# Patient Record
Sex: Male | Born: 1965 | Race: White | Hispanic: No | Smoking: Current every day smoker
Health system: Southern US, Community
[De-identification: ages and names within clinical notes are randomized; demographics above are authoritative.]

## PROBLEM LIST (undated history)

## (undated) DIAGNOSIS — L4050 Arthropathic psoriasis, unspecified: Secondary | ICD-10-CM

---

## 2019-10-08 ENCOUNTER — Emergency Department
Admission: EM | Admit: 2019-10-08 | Discharge: 2019-10-09 | Disposition: A | Discharge status: Other Acute Inpatient Hospital | Payer: Self-pay | Attending: Psychiatry | Admitting: Psychiatry

## 2019-10-08 ENCOUNTER — Emergency Department: Payer: Self-pay

## 2019-10-08 ENCOUNTER — Encounter: Payer: Self-pay | Admitting: Emergency Medicine

## 2019-10-08 ENCOUNTER — Other Ambulatory Visit: Payer: Self-pay

## 2019-10-08 DIAGNOSIS — Z20822 Contact with and (suspected) exposure to covid-19: Secondary | ICD-10-CM | POA: Insufficient documentation

## 2019-10-08 DIAGNOSIS — T424X1A Poisoning by benzodiazepines, accidental (unintentional), initial encounter: Secondary | ICD-10-CM

## 2019-10-08 DIAGNOSIS — F329 Major depressive disorder, single episode, unspecified: Secondary | ICD-10-CM | POA: Diagnosis present

## 2019-10-08 DIAGNOSIS — R4182 Altered mental status, unspecified: Secondary | ICD-10-CM | POA: Insufficient documentation

## 2019-10-08 DIAGNOSIS — F32A Depression, unspecified: Secondary | ICD-10-CM | POA: Diagnosis present

## 2019-10-08 DIAGNOSIS — F319 Bipolar disorder, unspecified: Secondary | ICD-10-CM

## 2019-10-08 DIAGNOSIS — T50904A Poisoning by unspecified drugs, medicaments and biological substances, undetermined, initial encounter: Secondary | ICD-10-CM | POA: Insufficient documentation

## 2019-10-08 HISTORY — DX: Arthropathic psoriasis, unspecified: L40.50

## 2019-10-08 LAB — CBC WITH DIFFERENTIAL/PLATELET
Abs Immature Granulocytes: 0.04 10*3/uL (ref 0.00–0.07)
Basophils Absolute: 0.1 10*3/uL (ref 0.0–0.1)
Basophils Relative: 1 %
Eosinophils Absolute: 0.1 10*3/uL (ref 0.0–0.5)
Eosinophils Relative: 1 %
HCT: 46.1 % (ref 39.0–52.0)
Hemoglobin: 15.9 g/dL (ref 13.0–17.0)
Immature Granulocytes: 0 %
Lymphocytes Relative: 26 %
Lymphs Abs: 2.8 10*3/uL (ref 0.7–4.0)
MCH: 33.1 pg (ref 26.0–34.0)
MCHC: 34.5 g/dL (ref 30.0–36.0)
MCV: 96 fL (ref 80.0–100.0)
Monocytes Absolute: 0.6 10*3/uL (ref 0.1–1.0)
Monocytes Relative: 6 %
Neutro Abs: 7.3 10*3/uL (ref 1.7–7.7)
Neutrophils Relative %: 66 %
Platelets: 280 10*3/uL (ref 150–400)
RBC: 4.8 MIL/uL (ref 4.22–5.81)
RDW: 12.1 % (ref 11.5–15.5)
WBC: 10.9 10*3/uL — ABNORMAL HIGH (ref 4.0–10.5)
nRBC: 0 % (ref 0.0–0.2)

## 2019-10-08 LAB — URINE DRUG SCREEN, QUALITATIVE (ARMC ONLY)
Amphetamines, Ur Screen: NOT DETECTED
Barbiturates, Ur Screen: NOT DETECTED
Benzodiazepine, Ur Scrn: POSITIVE — AB
Cannabinoid 50 Ng, Ur ~~LOC~~: NOT DETECTED
Cocaine Metabolite,Ur ~~LOC~~: NOT DETECTED
MDMA (Ecstasy)Ur Screen: NOT DETECTED
Methadone Scn, Ur: NOT DETECTED
Opiate, Ur Screen: NOT DETECTED
Phencyclidine (PCP) Ur S: NOT DETECTED
Tricyclic, Ur Screen: NOT DETECTED

## 2019-10-08 LAB — ETHANOL: Alcohol, Ethyl (B): <10 mg/dL (ref <10)

## 2019-10-08 LAB — COMPREHENSIVE METABOLIC PANEL
ALT: 16 U/L (ref 0–44)
AST: 14 U/L — ABNORMAL LOW (ref 15–41)
Albumin: 4.2 g/dL (ref 3.5–5.0)
Alkaline Phosphatase: 86 U/L (ref 38–126)
Anion gap: 6 (ref 5–15)
BUN: 9 mg/dL (ref 6–20)
CO2: 28 mmol/L (ref 22–32)
Calcium: 8.6 mg/dL — ABNORMAL LOW (ref 8.9–10.3)
Chloride: 103 mmol/L (ref 98–111)
Creatinine, Ser: 0.93 mg/dL (ref 0.61–1.24)
GFR calc Af Amer: >60 mL/min (ref >60)
GFR calc non Af Amer: >60 mL/min (ref >60)
Glucose, Bld: 105 mg/dL — ABNORMAL HIGH (ref 70–99)
Potassium: 3.6 mmol/L (ref 3.5–5.1)
Sodium: 137 mmol/L (ref 135–145)
Total Bilirubin: 0.6 mg/dL (ref 0.3–1.2)
Total Protein: 6.8 g/dL (ref 6.5–8.1)

## 2019-10-08 LAB — URINALYSIS, ROUTINE W REFLEX MICROSCOPIC
Bilirubin Urine: NEGATIVE
Glucose, UA: NEGATIVE mg/dL
Hgb urine dipstick: NEGATIVE
Ketones, ur: NEGATIVE mg/dL
Leukocytes,Ua: NEGATIVE
Nitrite: NEGATIVE
Protein, ur: NEGATIVE mg/dL
Specific Gravity, Urine: 1.003 — ABNORMAL LOW (ref 1.005–1.030)
pH: 7 (ref 5.0–8.0)

## 2019-10-08 LAB — SALICYLATE LEVEL
Salicylate Lvl: <7 mg/dL — ABNORMAL LOW (ref 7.0–30.0)
Salicylate Lvl: <7 mg/dL — ABNORMAL LOW (ref 7.0–30.0)

## 2019-10-08 LAB — ACETAMINOPHEN LEVEL
Acetaminophen (Tylenol), Serum: <10 ug/mL — ABNORMAL LOW (ref 10–30)
Acetaminophen (Tylenol), Serum: <10 ug/mL — ABNORMAL LOW (ref 10–30)

## 2019-10-08 LAB — MAGNESIUM: Magnesium: 2.4 mg/dL (ref 1.7–2.4)

## 2019-10-08 MED ORDER — DIPHENHYDRAMINE HCL 50 MG/ML IJ SOLN
50.0000 mg | Freq: Once | INTRAMUSCULAR | Status: AC
  Administered 2019-10-08: 19:00:00 50 mg via INTRAMUSCULAR

## 2019-10-08 MED ORDER — DIPHENHYDRAMINE HCL 50 MG/ML IJ SOLN
INTRAMUSCULAR | Status: AC
  Filled 2019-10-08: qty 1

## 2019-10-08 MED ORDER — HALOPERIDOL LACTATE 5 MG/ML IJ SOLN
INTRAMUSCULAR | Status: AC
Start: 2019-10-08 — End: 2019-10-09
  Filled 2019-10-08: qty 1

## 2019-10-08 MED ORDER — LORAZEPAM 2 MG/ML IJ SOLN
2.0000 mg | Freq: Once | INTRAMUSCULAR | Status: AC
  Administered 2019-10-08: 2 mg via INTRAMUSCULAR

## 2019-10-08 MED ORDER — HALOPERIDOL LACTATE 5 MG/ML IJ SOLN
5.0000 mg | Freq: Once | INTRAMUSCULAR | Status: AC
  Administered 2019-10-08: 5 mg via INTRAMUSCULAR

## 2019-10-08 MED ORDER — LORAZEPAM 2 MG/ML IJ SOLN
INTRAMUSCULAR | Status: AC
  Filled 2019-10-08: qty 1

## 2019-10-08 NOTE — ED Notes (Addendum)
Writer placed blankets on pt because of pt stating he was cold. Pt woke up and asked writer "where am I?", " how did I get here?" explained to pt where he was and how he got here.
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
## 2019-10-08 NOTE — ED Provider Notes (Signed)
[facility] Department Provider Note  ____________________________________________   First MD Initiated Contact with Patient 10/08/19 1326     (approximate)  I have reviewed the triage vital signs and the nursing notes.   HISTORY  Chief Complaint Drug Overdose    HPI Tom Smith. is a 54 y.o. male with history of psoriatic arthritis who comes in with concerns for overdose.  Patient was brought in by family member for concern for overdose.  Unclear exactly what he took.  There was concern for possible Xanax versus Zoloft although he tells me he did not take anything.  When asked if he was trying to do intentionally he says "trying to get out ".  Essentially there is some concern that this could have been intentional in nature.  Patient is alert and oriented x3 although does have slurred speech.  Patient is unable to give a full history of what happened.  Unable to get full history due to altered mental status          Past Medical History:  Diagnosis Date  . Psoriatic arthritis ([place])     There are no problems to display for this patient.   History reviewed. No pertinent surgical history.  Prior to Admission medications   Not on File    Allergies Patient has no known allergies.  No family history on file.  Social History Social History   Tobacco Use  . Smoking status: Unknown If Ever Smoked  Substance Use Topics  . Alcohol use: Not Currently    Comment: UTA  . Drug use: Yes    Comment: Overdosed on prescription pills 3/7      Review of Systems Unable to get full review of systems due to patient's altered mental status ____________________________________________   PHYSICAL EXAM:  VITAL SIGNS: ED Triage Vitals [10/08/19 1316]  Enc Vitals Group     BP 113/79     Pulse Rate (!) 103     Resp 20     Temp 97.8 F (36.6 C)     Temp Source Oral     SpO2 99 %     Weight 180 lb (81.6 kg)     Height 5\' 6"  (1.676 m)       Head Circumference      Peak Flow      Pain Score 0     Pain Loc      Pain Edu?      Excl. in [place]?     Constitutional: Alert and oriented.  Although has significantly slurred speech Eyes: Conjunctivae are normal. EOMI. Head: Atraumatic. Nose: No congestion/rhinnorhea. Mouth/Throat: Mucous membranes are moist.   Neck: No stridor. Trachea Midline. FROM Cardiovascular: Tachycardic, regular rhythm. Grossly normal heart sounds.  Good peripheral circulation. Respiratory: Normal respiratory effort.  No retractions. Lungs CTAB. Gastrointestinal: Soft and nontender. No distention. No abdominal bruits.  Musculoskeletal: No lower extremity tenderness nor edema.  No joint effusions. Neurologic: Cranial nerves appear intact with equal strength in arms and legs although he does have significant slurred speech and difficulty with moving from the wheelchair to the bed due to concern for intoxication Skin:  Skin is warm, dry and intact. No rash noted. Psychiatric: Possible SI.  Unclear.  Possible overdose GU: Deferred   ____________________________________________   LABS (all labs ordered are listed, but only abnormal results are displayed)  Labs Reviewed  CBC WITH DIFFERENTIAL/PLATELET - Abnormal; Notable for the following components:      Result Value  WBC 10.9 (*)    All other components within normal limits  COMPREHENSIVE METABOLIC PANEL  MAGNESIUM  ETHANOL  URINE DRUG SCREEN, QUALITATIVE ([facility] ONLY)  URINALYSIS, ROUTINE W REFLEX MICROSCOPIC  SALICYLATE LEVEL  ACETAMINOPHEN LEVEL   ____________________________________________   ED ECG REPORT I, Concha Se, the attending physician, personally viewed and interpreted this ECG.   EKG is normal sinus rate of 91, no ST elevation, no T wave inversions, normal intervals ____________________________________________  RADIOLOGY Vela Prose, personally viewed and evaluated these images (plain radiographs) as part of my medical  decision making, as well as reviewing the written report by the radiologist.  ED MD interpretation:  Pending   Official radiology report(s): No results found.  ____________________________________________   PROCEDURES  Procedure(s) performed (including Critical Care):  Procedures   ____________________________________________   INITIAL IMPRESSION / ASSESSMENT AND PLAN / ED COURSE  Tom Smith. was evaluated in Emergency Department on 10/08/2019 for the symptoms described in the history of present illness. He was evaluated in the context of the global COVID-19 pandemic, which necessitated consideration that the patient might be at risk for infection with the SARS-CoV-2 virus that causes COVID-19. Institutional protocols and algorithms that pertain to the evaluation of patients at risk for COVID-19 are in a state of rapid change based on information released by regulatory bodies including the CDC and federal and state organizations. These policies and algorithms were followed during the patient's care in the ED.     Patient is a 54 year old who comes in with concern for possible drug overdose although patient is alert and oriented x3 he is unable to give [place] any other information of what he took or when he took medications.  Will need to get labs now and repeat 4-hour Tylenol and aspirin levels.  Will get CT head to make sure no evidence of intracranial hemorrhage.  Will get other labs to evaluate for electrolyte abnormalities, AKI.  Will get urine drug screen.  Given patient's altered mental status and the concern this could have been SI will IVC patient  Patient is labs are reassuring.  Ordered four hour Tylenol aspirin levels.  Reevaluated patient and he is sleeping.  Patient handed off to oncoming team pending CT reads, repeat Tylenol salicylate levels and then placing consults to be evaluated by psych and TTS once patient is a wake       ____________________________________________   FINAL CLINICAL IMPRESSION(S) / ED DIAGNOSES   Final diagnoses:  Drug overdose, undetermined intent, initial encounter      MEDICATIONS GIVEN DURING THIS VISIT:  Medications - No data to display   ED Discharge Orders    None       Note:  This document was prepared using Dragon voice recognition software and may include unintentional dictation errors.   Concha Se, MD 10/08/19 1455

## 2019-10-08 NOTE — ED Triage Notes (Signed)
Pt now endorsing that he took medications at approx 1145. Pt continues to have slurred speech, but is oriented to time, oriented to situation, oriented to person, disoriented to place. Pt brought back to room 3, EDP and primary RN at bedside at this time.

## 2019-10-08 NOTE — ED Notes (Signed)
Dr Derrill Kay informed pt sleeping at this time and he is good with removing restraints. Restraints removed from both arms and legs. Pt remains asleep. Will continue to monitor.

## 2019-10-08 NOTE — ED Triage Notes (Signed)
Pt states took overdose of a knock off of Xanax. Pt states he lost count of how much he took PTA. Pt noted to be lethargic with slurred/soft speech. Pt states "trying to get out" when asked if overdose was intentional.   Pt now states that he overdosed on generic Zoloft, unable to state how much he took.
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
## 2019-10-08 NOTE — ED Triage Notes (Signed)
First Rn Note: pt presents to ED via wheelchair with c/o overdose, pt presents with family member who states patient's overdosed. Pt arrives to ED alert and able to answer some questions.

## 2019-10-08 NOTE — ED Provider Notes (Signed)
Per report from nursing staff patient awoke and was found in the hallway. When staff approached patient he started swinging at staff. By the time of my arrival the patient was being restrained by staff. He was agitated, yelling and cursing. Given violent behavior the patient was given benadryl, haldol and ativan. He will be placed in soft restraints until patient becomes calm and no longer poses a threat to staff.   Patient did fall asleep after medication and staff was able to take patient out of restraints   Phineas Semen, MD 10/08/19 2339

## 2019-10-08 NOTE — ED Notes (Addendum)
Pt placed in 4 point restraints at this time per verbal order by Dr. Derrill Kay.

## 2019-10-08 NOTE — ED Notes (Signed)
Pt undressed by this RN, [place] EDT and Shanda Bumps, RN with assistance of [employer] to ensure pt did not kick while restraints removed from leg to remove his pants. Pt assisted to use urinal at this time also. Pt belongings placed in bag and include one pair of black canvas sneakers, one pair of black socks and one pair of blue underwear. Belongings in a bag brought from prior room include green plaid flannel type jacket and one camouflage t shirt. Total of 2 bags labeled and placed in locker room.

## 2019-10-09 ENCOUNTER — Other Ambulatory Visit: Payer: Self-pay

## 2019-10-09 ENCOUNTER — Encounter: Payer: Self-pay | Admitting: Psychiatry

## 2019-10-09 ENCOUNTER — Inpatient Hospital Stay
Admission: EM | Admit: 2019-10-09 | Discharge: 2019-10-16 | DRG: 885 | Disposition: A | Discharge status: Home / Self Care | Payer: Self-pay | Source: Intra-hospital | Attending: Psychiatry | Admitting: Psychiatry

## 2019-10-09 DIAGNOSIS — Z79899 Other long term (current) drug therapy: Secondary | ICD-10-CM

## 2019-10-09 DIAGNOSIS — F319 Bipolar disorder, unspecified: Secondary | ICD-10-CM | POA: Diagnosis present

## 2019-10-09 DIAGNOSIS — T424X1A Poisoning by benzodiazepines, accidental (unintentional), initial encounter: Secondary | ICD-10-CM

## 2019-10-09 DIAGNOSIS — F419 Anxiety disorder, unspecified: Secondary | ICD-10-CM | POA: Diagnosis present

## 2019-10-09 DIAGNOSIS — L4050 Arthropathic psoriasis, unspecified: Secondary | ICD-10-CM | POA: Diagnosis present

## 2019-10-09 DIAGNOSIS — F17210 Nicotine dependence, cigarettes, uncomplicated: Secondary | ICD-10-CM | POA: Diagnosis present

## 2019-10-09 DIAGNOSIS — F3130 Bipolar disorder, current episode depressed, mild or moderate severity, unspecified: Principal | ICD-10-CM | POA: Diagnosis present

## 2019-10-09 DIAGNOSIS — Z23 Encounter for immunization: Secondary | ICD-10-CM

## 2019-10-09 DIAGNOSIS — R45851 Suicidal ideations: Secondary | ICD-10-CM | POA: Diagnosis present

## 2019-10-09 DIAGNOSIS — F32A Depression, unspecified: Secondary | ICD-10-CM | POA: Diagnosis present

## 2019-10-09 DIAGNOSIS — F329 Major depressive disorder, single episode, unspecified: Secondary | ICD-10-CM | POA: Diagnosis present

## 2019-10-09 DIAGNOSIS — Z20822 Contact with and (suspected) exposure to covid-19: Secondary | ICD-10-CM | POA: Diagnosis present

## 2019-10-09 DIAGNOSIS — Z915 Personal history of self-harm: Secondary | ICD-10-CM

## 2019-10-09 DIAGNOSIS — G4700 Insomnia, unspecified: Secondary | ICD-10-CM | POA: Diagnosis present

## 2019-10-09 LAB — RESPIRATORY PANEL BY RT PCR (FLU A&B, COVID)
Influenza A by PCR: NEGATIVE
Influenza B by PCR: NEGATIVE
SARS Coronavirus 2 by RT PCR: NEGATIVE

## 2019-10-09 MED ORDER — MAGNESIUM HYDROXIDE 400 MG/5ML PO SUSP: 30.0000 mL | Freq: Every day | ORAL | Status: DC | PRN

## 2019-10-09 MED ORDER — ALUM & MAG HYDROXIDE-SIMETH 200-200-20 MG/5ML PO SUSP: 30.0000 mL | ORAL | Status: DC | PRN

## 2019-10-09 MED ORDER — PNEUMOCOCCAL VAC POLYVALENT 25 MCG/0.5ML IJ INJ
0.5000 mL | INJECTION | INTRAMUSCULAR | Status: DC
  Filled 2019-10-09: qty 0.5

## 2019-10-09 MED ORDER — QUETIAPINE FUMARATE 25 MG PO TABS
50.0000 mg | ORAL_TABLET | Freq: Every day | ORAL | Status: DC
  Administered 2019-10-09: 50 mg via ORAL
  Filled 2019-10-09: qty 2

## 2019-10-09 MED ORDER — INFLUENZA VAC SPLIT QUAD 0.5 ML IM SUSY: 0.5000 mL | PREFILLED_SYRINGE | INTRAMUSCULAR | Status: DC

## 2019-10-09 MED ORDER — ACETAMINOPHEN 325 MG PO TABS
650.0000 mg | ORAL_TABLET | Freq: Four times a day (QID) | ORAL | Status: DC | PRN
  Administered 2019-10-10 – 2019-10-13 (×5): 650 mg via ORAL
  Filled 2019-10-09 (×5): qty 2

## 2019-10-09 NOTE — ED Notes (Signed)
Report provided to [facility] RN. Pt to be moved with tech and officer. Pts 2 bag with personal belongings sent with pt.

## 2019-10-09 NOTE — BH Assessment (Signed)
Assessment Note  Tom Smith. is an 54 y.o. male who presents to the ER due to taking an intentional overdose on his medication. Patient admits to dealing with depression and his close friends have told him they believe he was bipolar. Patient hasn't had any medications in approximately a year and have difficulty managing his mental health. He's been inpatient once and follow up with outpatient treatment in 2008.  During the interview, the patient was calm, cooperative and pleasant. However, upon arrival to the ER and initial interaction, the patient was aggressive and required restraints and IM medications. Patient was impaired. However, with this Clinical research associate the patient was polite and respectful. When ER Staff told him about what happened, he reported he didn't remember any of it and was apologetic. Patient denies HI and AV/H.  Diagnosis: Bipolar  Past Medical History:  Past Medical History:  Diagnosis Date  . Psoriatic arthritis ([facility])     History reviewed. No pertinent surgical history.  Family History: No family history on file.  Social History:  reports previous alcohol use. He reports current drug use. No history on file for tobacco.  Additional Social History:  Alcohol / Drug Use Pain Medications: See PTA Prescriptions: See PTA Over the Counter: See PTA History of alcohol / drug use?: Yes Longest period of sobriety (when/how long): Unable to quantify Substance #1 Name of Substance 1: Cannabis 1 - Last Use / Amount: 10/08/2019  CIWA: CIWA-Ar BP: 99/66 Pulse Rate: 68 COWS:    Allergies: No Known Allergies  Home Medications: (Not in a hospital admission)   OB/GYN Status:  No LMP for male patient.  General Assessment Data Location of Assessment: [facility] ED TTS Assessment: In system Is this a Tele or Face-to-Face Assessment?: Face-to-Face Is this an Initial Assessment or a Re-assessment for this encounter?: Initial Assessment Patient Accompanied by:: N/A Language Other  than English: No Living Arrangements: Other (Comment)(Private Home) What gender do you identify as?: Male Marital status: Single Pregnancy Status: No Living Arrangements: Alone Can pt return to current living arrangement?: No Admission Status: Involuntary Petitioner: ED Attending Is patient capable of signing voluntary admission?: No(Under IVC) Referral Source: Self/Family/Friend Insurance type: None  Medical Screening Exam [facility] Walk-in ONLY) Medical Exam completed: Yes  Crisis Care Plan Living Arrangements: Alone Legal Guardian: Other:(Self) Name of Psychiatrist: Reports of none Name of Therapist: Reports of none  Education Status Is patient currently in school?: No Is the patient employed, unemployed or receiving disability?: Unemployed, Receiving disability income  Risk to self with the past 6 months Suicidal Ideation: Yes-Currently Present Has patient been a risk to self within the past 6 months prior to admission? : Yes Suicidal Intent: Yes-Currently Present Has patient had any suicidal intent within the past 6 months prior to admission? : Yes Is patient at risk for suicide?: Yes Suicidal Plan?: Yes-Currently Present Has patient had any suicidal plan within the past 6 months prior to admission? : Yes Specify Current Suicidal Plan: Overdose on medications Access to Means: Yes Specify Access to Suicidal Means: Medications What has been your use of drugs/alcohol within the last 12 months?: Cannabis Previous Attempts/Gestures: No How many times?: 2 Other Self Harm Risks: Reports of none Triggers for Past Attempts: Unknown Intentional Self Injurious Behavior: None Family Suicide History: No Recent stressful life event(s): Conflict (Comment), Loss (Comment), Other (Comment) Persecutory voices/beliefs?: No Depression: Yes Depression Symptoms: Isolating, Fatigue, Guilt, Tearfulness, Loss of interest in usual pleasures, Feeling worthless/self pity Substance abuse history  and/or treatment for substance  abuse?: No Suicide prevention information given to non-admitted patients: Not applicable  Risk to Others within the past 6 months Homicidal Ideation: No Does patient have any lifetime risk of violence toward others beyond the six months prior to admission? : No Thoughts of Harm to Others: No Current Homicidal Intent: No Current Homicidal Plan: No Access to Homicidal Means: No Identified Victim: Reports of none History of harm to others?: No Assessment of Violence: None Noted Violent Behavior Description: Upon arrival to the ER Does patient have access to weapons?: No Criminal Charges Pending?: No Does patient have a court date: No Is patient on probation?: No  Psychosis Hallucinations: None noted Delusions: None noted  Mental Status Report Appearance/Hygiene: Unremarkable, In scrubs Eye Contact: Good Motor Activity: Freedom of movement, Unremarkable Speech: Logical/coherent, Unremarkable Level of Consciousness: Alert Mood: Anxious, Sad, Pleasant Affect: Appropriate to circumstance, Depressed Anxiety Level: Minimal Thought Processes: Coherent, Relevant Judgement: Unimpaired Orientation: Person, Place, Time, Situation, Appropriate for developmental age Obsessive Compulsive Thoughts/Behaviors: None  Cognitive Functioning Concentration: Normal Memory: Recent Intact, Remote Intact Is patient IDD: No Insight: Fair Impulse Control: Fair Appetite: Good Have you had any weight changes? : No Change Sleep: No Change Total Hours of Sleep: 8 Vegetative Symptoms: None  ADLScreening [facility] Assessment Services) Patient's cognitive ability adequate to safely complete daily activities?: Yes Patient able to express need for assistance with ADLs?: Yes Independently performs ADLs?: Yes (appropriate for developmental age)  Prior Inpatient Therapy Prior Inpatient Therapy: Yes Prior Therapy Dates: 2008 Prior Therapy Facilty/Provider(s): Unable to remember  the name Reason for Treatment: Depression  Prior Outpatient Therapy Prior Outpatient Therapy: Yes Prior Therapy Dates: 2008 Prior Therapy Facilty/Provider(s): Unable to remember the name Reason for Treatment: Depression Does patient have an ACCT team?: No Does patient have Intensive In-House Services?  : No Does patient have [facility] services? : No Does patient have P4CC services?: No  ADL Screening (condition at time of admission) Patient's cognitive ability adequate to safely complete daily activities?: Yes Is the patient deaf or have difficulty hearing?: No Does the patient have difficulty seeing, even when wearing glasses/contacts?: No Does the patient have difficulty concentrating, remembering, or making decisions?: No Patient able to express need for assistance with ADLs?: Yes Does the patient have difficulty dressing or bathing?: No Independently performs ADLs?: Yes (appropriate for developmental age) Does the patient have difficulty walking or climbing stairs?: No Weakness of Legs: None Weakness of Arms/Hands: None  Home Assistive Devices/Equipment Home Assistive Devices/Equipment: None  Therapy Consults (therapy consults require a physician order) PT Evaluation Needed: No OT Evalulation Needed: No SLP Evaluation Needed: No Abuse/Neglect Assessment (Assessment to be complete while patient is alone) Abuse/Neglect Assessment Can Be Completed: Yes Physical Abuse: Denies Verbal Abuse: Denies Sexual Abuse: Denies Exploitation of patient/patient's resources: Denies Self-Neglect: Denies Values / Beliefs Cultural Requests During Hospitalization: None Spiritual Requests During Hospitalization: None Consults Spiritual Care Consult Needed: No Transition of Care Team Consult Needed: No Advance Directives (For Healthcare) Does Patient Have a Medical Advance Directive?: Unable to assess, patient is non-responsive or altered mental status Nutrition Screen- MC  Adult/WL/AP Patient's home diet: Regular  Child/Adolescent Assessment Running Away Risk: Denies(Patient is an adult)  Disposition:  Disposition Initial Assessment Completed for this Encounter: Yes  On Site Evaluation by:   Reviewed with Physician:    Gunnar Fusi MS, LCAS, [facility], [place] Therapeutic Triage Specialist 10/09/2019 6:09 PM
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
## 2019-10-09 NOTE — ED Notes (Signed)
Pt awake and up to the door of his room. Pt reoriented to time, place and situation. Pt calm and cooperative at this time. Ambulated to the bathroom with no difficulty.
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
## 2019-10-09 NOTE — Tx Team (Signed)
Initial Treatment Plan 10/09/2019 10:18 PM Rhae Lerner. JIZ:[id number]    PATIENT STRESSORS: Substance abuse   PATIENT STRENGTHS: Average or above average intelligence Capable of independent living Communication skills Physical Health   PATIENT IDENTIFIED PROBLEMS:       suicidal  manic             DISCHARGE CRITERIA:  Ability to meet basic life and health needs Adequate post-discharge living arrangements Improved stabilization in mood, thinking, and/or behavior Medical problems require only outpatient monitoring Motivation to continue treatment in a less acute level of care Need for constant or close observation no longer present Reduction of life-threatening or endangering symptoms to within safe limits Safe-care adequate arrangements made Verbal commitment to aftercare and medication compliance  PRELIMINARY DISCHARGE PLAN: Outpatient therapy Return to previous living arrangement  PATIENT/FAMILY INVOLVEMENT: This treatment plan has been presented to and reviewed with the patient, Tom Smith., and/or family member.  The patient and family have been given the opportunity to ask questions and make suggestions.  Tom [place], RN 10/09/2019, 10:18 PM

## 2019-10-09 NOTE — ED Notes (Signed)
Dr.Clapacs at bedside  

## 2019-10-09 NOTE — ED Notes (Signed)
IVC  PENDING  GOING  TO  BEH  MED  TONIGHT 

## 2019-10-09 NOTE — ED Notes (Signed)
Pts sister on phone but is unable to provide this RN with password. This RN unable to provided information on pt. This RN gave allotted times that pt can make phone calls. Sister hung phone up on this RN.

## 2019-10-09 NOTE — Consult Note (Signed)
[facility] Face-to-Face Psychiatry Consult   Reason for Consult: Consult for this 54 year old man who came to the emergency room yesterday after an overdose of medication Referring Physician: Cyril Loosen Patient Identification: Tom Smith. MRN:  [id number] Principal Diagnosis: Bipolar depression (HCC) Diagnosis:  Principal Problem:   Bipolar depression (HCC) Active Problems:   Benzodiazepine overdose   Total Time spent with patient: 1 hour  Subjective:   Tom Smith. is a 54 y.o. male patient admitted with "I do not even remember how I got here".  HPI: Patient seen chart reviewed.  54 year old man brought into the hospital emergency room yesterday by family with a report that he had taken an overdose of medication.  Evidently patient believes that he took mostly Xanax.  He says that he last thing he remembers was going out to the shed and taking a handful of Xanax.  He admits that there was suicidal ideation and intent involved with this.  He talks about how he has felt like it is time to "check out" and that he has never felt so empty and hopeless.  Patient says he has felt terrible and hopeless for months.  Feels negative all the time but also irritable.  He sleeps poorly at night.  Patient says he used to drink but he stopped last August and has stayed off of alcohol but his mood if anything has gotten worse.  Admits to having suicidal thoughts and that the overdose on Xanax had suicidal intent.  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  Denies any hallucinations.  He talks about how back in the summer he was on a "manic high" and when he stopped drinking he "crashed" into his current condition.  Past Psychiatric History: Somewhat evasive about this.  He will admit to seeing a therapist in 2008.  Admits that when he was in the [employer] many years ago he was in a psychiatric hospital  but claims to have never had hospitalizations since then.  Claims to have never been on any medication as an adult.  Admits that people of suggested that he had bipolar disorder.  Risk to Self:   Risk to Others:   Prior Inpatient Therapy:   Prior Outpatient Therapy:    Past Medical History:  Past Medical History:  Diagnosis Date  . Psoriatic arthritis (HCC)    History reviewed. No pertinent surgical history. Family History: No family history on file. Family Psychiatric  History: Patient says there are several people in his family with serious mental health problems including a cousin who killed himself. Social History:  Social History   Substance and Sexual Activity  Alcohol Use Not Currently   Comment: UTA     Social History   Substance and Sexual Activity  Drug Use Yes   Comment: Overdosed on prescription pills 3/7    Social History   Socioeconomic History  . Marital status: Divorced    Spouse name: Not on file  . Number of children: Not on file  . Years of education: Not on file  . Highest education level: Not on file  Occupational History  . Not on file  Tobacco Use  . Smoking status: Unknown If Ever Smoked  Substance and Sexual Activity  . Alcohol use: Not Currently    Comment: UTA  . Drug use: Yes    Comment: Overdosed on prescription pills 3/7  .  Sexual activity: Not on file  Other Topics Concern  . Not on file  Social History Narrative  . Not on file   Social Determinants of Health   Financial Resource Strain:   . Difficulty of Paying Living Expenses: Not on file  Food Insecurity:   . Worried About Programme researcher, broadcasting/film/videounning Out of Food in the Last Year: Not on file  . Ran Out of Food in the Last Year: Not on file  Transportation Needs:   . Lack of Transportation (Medical): Not on file  . Lack of Transportation (Non-Medical): Not on file  Physical Activity:   . Days of Exercise per Week: Not on file  . Minutes of Exercise per Session: Not on file  Stress:   .  Feeling of Stress : Not on file  Social Connections:   . Frequency of Communication with Friends and Family: Not on file  . Frequency of Social Gatherings with Friends and Family: Not on file  . Attends Religious Services: Not on file  . Active Member of Clubs or Organizations: Not on file  . Attends BankerClub or Organization Meetings: Not on file  . Marital Status: Not on file   Additional Social History:    Allergies:  No Known Allergies  Labs:  Results for orders placed or performed during the hospital encounter of 10/08/19 (from the past 48 hour(s))  CBC with Differential     Status: Abnormal   Collection Time: 10/08/19  1:28 PM  Result Value Ref Range   WBC 10.9 (H) 4.0 - 10.5 K/uL   RBC 4.80 4.22 - 5.81 MIL/uL   Hemoglobin 15.9 13.0 - 17.0 g/dL   HCT 29.546.1 28.439.0 - 13.252.0 %   MCV 96.0 80.0 - 100.0 fL   MCH 33.1 26.0 - 34.0 pg   MCHC 34.5 30.0 - 36.0 g/dL   RDW 44.012.1 10.211.5 - 72.515.5 %   Platelets 280 150 - 400 K/uL   nRBC 0.0 0.0 - 0.2 %   Neutrophils Relative % 66 %   Neutro Abs 7.3 1.7 - 7.7 K/uL   Lymphocytes Relative 26 %   Lymphs Abs 2.8 0.7 - 4.0 K/uL   Monocytes Relative 6 %   Monocytes Absolute 0.6 0.1 - 1.0 K/uL   Eosinophils Relative 1 %   Eosinophils Absolute 0.1 0.0 - 0.5 K/uL   Basophils Relative 1 %   Basophils Absolute 0.1 0.0 - 0.1 K/uL   Immature Granulocytes 0 %   Abs Immature Granulocytes 0.04 0.00 - 0.07 K/uL    Comment: Performed at [facility], [address] [place], [place] [address]  Comprehensive metabolic panel     Status: Abnormal   Collection Time: 10/08/19  1:28 PM  Result Value Ref Range   Sodium 137 135 - 145 mmol/L   Potassium 3.6 3.5 - 5.1 mmol/L   Chloride 103 98 - 111 mmol/L   CO2 28 22 - 32 mmol/L   Glucose, Bld 105 (H) 70 - 99 mg/dL    Comment: Glucose reference range applies only to samples taken after fasting for at least 8 hours.   BUN 9 6 - 20 mg/dL   Creatinine, Ser 0.340.93 0.61 - 1.24 mg/dL   Calcium 8.6 (L) 8.9 - 10.3  mg/dL   Total Protein 6.8 6.5 - 8.1 g/dL   Albumin 4.2 3.5 - 5.0 g/dL   AST 14 (L) 15 - 41 U/L   ALT 16 0 - 44 U/L   Alkaline Phosphatase 86 38 - 126 U/L  Total Bilirubin 0.6 0.3 - 1.2 mg/dL   GFR calc non Af Amer >60 >60 mL/min   GFR calc Af Amer >60 >60 mL/min   Anion gap 6 5 - 15    Comment: Performed at [facility], [address] [place], [place] [address]  Magnesium     Status: None   Collection Time: 10/08/19  1:28 PM  Result Value Ref Range   Magnesium 2.4 1.7 - 2.4 mg/dL    Comment: Performed at [facility], [address] [place], [place] [address]  Ethanol     Status: None   Collection Time: 10/08/19  1:28 PM  Result Value Ref Range   Alcohol, Ethyl (B) <10 <10 mg/dL    Comment: (NOTE) Lowest detectable limit for serum alcohol is 10 mg/dL. For medical purposes only. Performed at [facility], [address] [place], [place] [address]   Salicylate level     Status: Abnormal   Collection Time: 10/08/19  1:28 PM  Result Value Ref Range   Salicylate Lvl <7.0 (L) 7.0 - 30.0 mg/dL    Comment: Performed at [facility], [address] [place], [place] [address]  Acetaminophen level     Status: Abnormal   Collection Time: 10/08/19  1:28 PM  Result Value Ref Range   Acetaminophen (Tylenol), Serum <10 (L) 10 - 30 ug/mL    Comment: (NOTE) Therapeutic concentrations vary significantly. A range of 10-30 ug/mL  may be an effective concentration for many patients. However, some  are best treated at concentrations outside of this range. Acetaminophen concentrations >150 ug/mL at 4 hours after ingestion  and >50 ug/mL at 12 hours after ingestion are often associated with  toxic reactions. Performed at [facility], [address], [place], [place] [address]   Urine Drug Screen, Qualitative [facility] only)     Status: Abnormal   Collection Time: 10/08/19  7:11 PM  Result Value Ref Range   Tricyclic, Ur Screen NONE DETECTED  NONE DETECTED   Amphetamines, Ur Screen NONE DETECTED NONE DETECTED   MDMA (Ecstasy)Ur Screen NONE DETECTED NONE DETECTED   Cocaine Metabolite,Ur [place] NONE DETECTED NONE DETECTED   Opiate, Ur Screen NONE DETECTED NONE DETECTED   Phencyclidine (PCP) Ur S NONE DETECTED NONE DETECTED   Cannabinoid 50 Ng, Ur [place] NONE DETECTED NONE DETECTED   Barbiturates, Ur Screen NONE DETECTED NONE DETECTED   Benzodiazepine, Ur Scrn POSITIVE (A) NONE DETECTED   Methadone Scn, Ur NONE DETECTED NONE DETECTED    Comment: (NOTE) Tricyclics + metabolites, urine    Cutoff 1000 ng/mL Amphetamines + metabolites, urine  Cutoff 1000 ng/mL MDMA (Ecstasy), urine              Cutoff 500 ng/mL Cocaine Metabolite, urine          Cutoff 300 ng/mL Opiate + metabolites, urine        Cutoff 300 ng/mL Phencyclidine (PCP), urine         Cutoff 25 ng/mL Cannabinoid, urine                 Cutoff 50 ng/mL Barbiturates + metabolites, urine  Cutoff 200 ng/mL Benzodiazepine, urine              Cutoff 200 ng/mL Methadone, urine                   Cutoff 300 ng/mL The urine drug screen provides only a preliminary, unconfirmed analytical test result and should not be  used for non-medical purposes. Clinical consideration and professional judgment should be applied to any positive drug screen result due to possible interfering substances. A more specific alternate chemical method must be used in order to obtain a confirmed analytical result. Gas chromatography / mass spectrometry (GC/MS) is the preferred confirmat ory method. Performed at [facility], [address] [place], [place] [address]   Urinalysis, Routine w reflex microscopic     Status: Abnormal   Collection Time: 10/08/19  7:11 PM  Result Value Ref Range   Color, Urine STRAW (A) YELLOW   APPearance CLEAR (A) CLEAR   Specific Gravity, Urine 1.003 (L) 1.005 - 1.030   pH 7.0 5.0 - 8.0   Glucose, UA NEGATIVE NEGATIVE mg/dL   Hgb urine dipstick NEGATIVE NEGATIVE    Bilirubin Urine NEGATIVE NEGATIVE   Ketones, ur NEGATIVE NEGATIVE mg/dL   Protein, ur NEGATIVE NEGATIVE mg/dL   Nitrite NEGATIVE NEGATIVE   Leukocytes,Ua NEGATIVE NEGATIVE    Comment: Performed at [facility], [address] [place], [place] [address]  Acetaminophen level     Status: Abnormal   Collection Time: 10/08/19  7:11 PM  Result Value Ref Range   Acetaminophen (Tylenol), Serum <10 (L) 10 - 30 ug/mL    Comment: (NOTE) Therapeutic concentrations vary significantly. A range of 10-30 ug/mL  may be an effective concentration for many patients. However, some  are best treated at concentrations outside of this range. Acetaminophen concentrations >150 ug/mL at 4 hours after ingestion  and >50 ug/mL at 12 hours after ingestion are often associated with  toxic reactions. Performed at [facility], [address] [place], [place] [address]   Salicylate level     Status: Abnormal   Collection Time: 10/08/19  7:11 PM  Result Value Ref Range   Salicylate Lvl <2.4 (L) 7.0 - 30.0 mg/dL    Comment: Performed at [facility], [address] [place], [place] [address]  Respiratory Panel by RT PCR (Flu A&B, Covid) - Nasopharyngeal Swab     Status: None   Collection Time: 10/09/19  8:21 AM   Specimen: Nasopharyngeal Swab  Result Value Ref Range   SARS Coronavirus 2 by RT PCR NEGATIVE NEGATIVE    Comment: (NOTE) SARS-CoV-2 target nucleic acids are NOT DETECTED. The SARS-CoV-2 RNA is generally detectable in upper respiratoy specimens during the acute phase of infection. The lowest concentration of SARS-CoV-2 viral copies this assay can detect is 131 copies/mL. A negative result does not preclude SARS-Cov-2 infection and should not be used as the sole basis for treatment or other patient management decisions. A negative result may occur with  improper specimen collection/handling, submission of specimen other than nasopharyngeal swab, presence of viral  mutation(s) within the areas targeted by this assay, and inadequate number of viral copies (<131 copies/mL). A negative result must be combined with clinical observations, patient history, and epidemiological information. The expected result is Negative. Fact Sheet for Patients:  PinkCheek.be Fact Sheet for Healthcare Providers:  GravelBags.it This test is not yet ap proved or cleared by the [place] FDA and  has been authorized for detection and/or diagnosis of SARS-CoV-2 by FDA under an Emergency Use Authorization (EUA). This EUA will remain  in effect (meaning this test can be used) for the duration of the COVID-19 declaration under Section 564(b)(1) of the Act, 21 U.S.C. section 360bbb-3(b)(1), unless the authorization is terminated or revoked sooner.    Influenza A by PCR NEGATIVE NEGATIVE   Influenza B  by PCR NEGATIVE NEGATIVE    Comment: (NOTE) The Xpert Xpress SARS-CoV-2/FLU/RSV assay is intended as an aid in  the diagnosis of influenza from Nasopharyngeal swab specimens and  should not be used as a sole basis for treatment. Nasal washings and  aspirates are unacceptable for Xpert Xpress SARS-CoV-2/FLU/RSV  testing. Fact Sheet for Patients: https://www.moore.com/ Fact Sheet for Healthcare Providers: https://www.young.biz/ This test is not yet approved or cleared by the [place] FDA and  has been authorized for detection and/or diagnosis of SARS-CoV-2 by  FDA under an Emergency Use Authorization (EUA). This EUA will remain  in effect (meaning this test can be used) for the duration of the  Covid-19 declaration under Section 564(b)(1) of the Act, 21  U.S.C. section 360bbb-3(b)(1), unless the authorization is  terminated or revoked. Performed at [facility], [address] [place], [place] [address]     No current facility-administered medications for this  encounter.   Current Outpatient Medications  Medication Sig Dispense Refill  . naproxen sodium (ALEVE) 220 MG tablet Take 440 mg by mouth daily as needed. As needed for tooth pain      Musculoskeletal: Strength & Muscle Tone: within normal limits Gait & Station: normal Patient leans: N/A  Psychiatric Specialty Exam: Physical Exam  Nursing note and vitals reviewed. Constitutional: He appears well-developed and well-nourished.  HENT:  Head: Normocephalic and atraumatic.  Eyes: Pupils are equal, round, and reactive to light. Conjunctivae are normal.  Cardiovascular: Regular rhythm and normal heart sounds.  Respiratory: Effort normal. No respiratory distress.  GI: Soft.  Musculoskeletal:        General: Normal range of motion.     Cervical back: Normal range of motion.  Neurological: He is alert.  Skin: Skin is warm and dry.  Psychiatric: His affect is labile. His speech is tangential. He is not agitated and not aggressive. Thought content is paranoid. Cognition and memory are impaired. He expresses impulsivity. He exhibits a depressed mood. He expresses suicidal ideation. He exhibits abnormal recent memory.    Review of Systems  Constitutional: Negative.   HENT: Negative.   Eyes: Negative.   Respiratory: Negative.   Cardiovascular: Negative.   Gastrointestinal: Negative.   Musculoskeletal: Negative.   Skin: Negative.   Neurological: Negative.   Psychiatric/Behavioral: Positive for behavioral problems, dysphoric mood, self-injury and suicidal ideas.    Blood pressure 99/66, pulse 68, temperature 97.7 F (36.5 C), temperature source Oral, resp. rate 18, height 5\' 6"  (1.676 m), weight 81.6 kg, SpO2 98 %.Body mass index is 29.05 kg/m.  General Appearance: Casual  Eye Contact:  Fair  Speech:  Slow  Volume:  Increased  Mood:  Anxious and Dysphoric  Affect:  Inappropriate  Thought Process:  Disorganized  Orientation:  Full (Time, Place, and Person)  Thought Content:   Illogical, Rumination and Tangential  Suicidal Thoughts:  Yes.  without intent/plan  Homicidal Thoughts:  No  Memory:  Immediate;   Fair Recent;   Poor Remote;   Poor  Judgement:  Impaired  Insight:  Shallow  Psychomotor Activity:  Decreased  Concentration:  Concentration: Poor  Recall:  Poor  Fund of Knowledge:  Fair  Language:  Fair  Akathisia:  No  Handed:  Right  AIMS (if indicated):     Assets:  Desire for Improvement Housing Physical Health Resilience Social Support  ADL's:  Intact  Cognition:  Impaired,  Mild  Sleep:        Treatment Plan Summary: Daily contact with patient to  assess and evaluate symptoms and progress in treatment, Medication management and Plan 54 year old man who appears to have made a serious suicide attempt by overdosing on Xanax.  Yesterday in the emergency room he was agitated and belligerent most likely delirious.  Required restraints at one point.  Today he is calm and relatively lucid although still with little insight.  He tells a story that would be most consistent with bipolar depression with suicide attempt.  Patient meets criteria for hospitalization.  Continue IVC.  Informed patient of plan.  Orders will be placed to admit to psychiatric unit.  Disposition: Recommend psychiatric Inpatient admission when medically cleared.  Mordecai Rasmussen, MD 10/09/2019 4:21 PM

## 2019-10-09 NOTE — [facility] Group Notes (Signed)
[facility] Group Notes:  (Nursing/MHT/Case Management/Adjunct)  Date:  10/09/2019  Time:  10:22 PM  Type of Therapy:  Group Therapy  Participation Level:  Did Not Attend    Landry Mellow 10/09/2019, 10:22 PM

## 2019-10-09 NOTE — Plan of Care (Signed)
  Problem: Education: Goal: Knowledge of [place] [facility] Education information/materials will improve Outcome: Progressing Goal: Emotional status will improve Outcome: Progressing Goal: Mental status will improve Outcome: Progressing Goal: Verbalization of understanding the information provided will improve Outcome: Progressing  D: Patient admitted after being involuntarily committed by a friend for overdosing on an unknown amount of xanax. Patient admits he was trying to commit suicide. 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 Patient's speech is rapid, pressured, loose and tangential. Admits to having had suicidal thoughts but is able to contract for safety. Skin search reveals tattoos on back and left shoulder and an abrasion on his right shin, and no other marks or contraband found. Patient was cooperative, med compliant and offered no complaints. A: Continue to monitor for safety R: Safety maintained.

## 2019-10-09 NOTE — ED Notes (Signed)
Pt's mother on the phone. Pt willing to speak with her at this time.

## 2019-10-09 NOTE — ED Notes (Signed)
TTS at beside.  

## 2019-10-09 NOTE — ED Notes (Signed)
Pt given meal tray.

## 2019-10-10 DIAGNOSIS — F319 Bipolar disorder, unspecified: Secondary | ICD-10-CM

## 2019-10-10 MED ORDER — QUETIAPINE FUMARATE 100 MG PO TABS
100.0000 mg | ORAL_TABLET | Freq: Every day | ORAL | Status: DC
  Administered 2019-10-10 – 2019-10-11 (×2): 100 mg via ORAL
  Filled 2019-10-10 (×2): qty 1

## 2019-10-10 NOTE — [facility] Group Notes (Signed)
[facility] Group Notes:  (Nursing/MHT/Case Management/Adjunct)  Date:  10/10/2019  Time:  8:30AM Type of Therapy:  [employer] .  Participation Level:  Active  Participation Quality:  Appropriate  Affect:  Appropriate  Cognitive:  Appropriate  Insight:  Appropriate  Engagement in Group:  None  Modes of Intervention:  Discussion and Orientation  Summary of Progress/Problems:  Tom Smith 10/10/2019, 9:23 AM

## 2019-10-10 NOTE — [facility] Group Notes (Signed)
LCSW Group Therapy Note  10/10/2019 1:00 PM  Type of Therapy/Topic:  Group Therapy:  Feelings about Diagnosis  Participation Level:  Minimal   Description of Group:   This group will allow patients to explore their thoughts and feelings about diagnoses they have received. Patients will be guided to explore their level of understanding and acceptance of these diagnoses. Facilitator will encourage patients to process their thoughts and feelings about the reactions of others to their diagnosis and will guide patients in identifying ways to discuss their diagnosis with significant others in their lives. This group will be process-oriented, with patients participating in exploration of their own experiences, giving and receiving support, and processing challenge from other group members.   Therapeutic Goals: 1. Patient will demonstrate understanding of diagnosis as evidenced by identifying two or more symptoms of the disorder 2. Patient will be able to express two feelings regarding the diagnosis 3. Patient will demonstrate their ability to communicate their needs through discussion and/or role play  Summary of Patient Progress: Patient was present in group. Patient did participate in group discussions and was supportive of other group members.  However pt declined to share his understanding of the groups, instead choosing to provide anecdotes of how he has been affected by the diagnosis.    Therapeutic Modalities:   Cognitive Behavioral Therapy Brief Therapy Feelings Identification   Penni Homans, MSW, LCSW 10/10/2019 12:47 PM

## 2019-10-10 NOTE — Progress Notes (Signed)
Pt declined referral for follow up stating he does not feel he needs also declined psychiatry referral stating he does not want to take medication and doesn't feel like it is worth it due to the side effects. Pt will be given resource list at [email or web address].   Iris Pert, MSW, LCSW Clinical Social Work 10/10/2019 12:16 PM
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
## 2019-10-10 NOTE — Progress Notes (Deleted)
Recreation Therapy Notes   Date: 10/10/2019  Time: 9:30 am   Location: Craft room   Behavioral response: N/A   Intervention Topic: Goals   Discussion/Intervention: Patient did not attend group.   Clinical Observations/Feedback:  Patient did not attend group.   Tom Smith LRT/CTRS        [name] 10/10/2019 12:32 PM

## 2019-10-10 NOTE — Tx Team (Addendum)
Interdisciplinary Treatment and Diagnostic Plan Update  10/10/2019 Time of Session: 900am Tom Smith. MRN: [id number]  Principal Diagnosis: <principal problem not specified>  Secondary Diagnoses: Active Problems:   Bipolar depression (HCC)   Current Medications:  Current Facility-Administered Medications  Medication Dose Route Frequency Provider Last Rate Last Admin  . acetaminophen (TYLENOL) tablet 650 mg  650 mg Oral Q6H PRN Clapacs, Jackquline Denmark, MD   650 mg at 10/10/19 0028  . alum & mag hydroxide-simeth (MAALOX/MYLANTA) 200-200-20 MG/5ML suspension 30 mL  30 mL Oral Q4H PRN Clapacs, John T, MD      . influenza vac split quadrivalent PF (FLUARIX) injection 0.5 mL  0.5 mL Intramuscular Tomorrow-1000 Clapacs, John T, MD      . magnesium hydroxide (MILK OF MAGNESIA) suspension 30 mL  30 mL Oral Daily PRN Clapacs, John T, MD      . pneumococcal 23 valent vaccine (PNEUMOVAX-23) injection 0.5 mL  0.5 mL Intramuscular Tomorrow-1000 Clapacs, John T, MD      . QUEtiapine (SEROQUEL) tablet 50 mg  50 mg Oral QHS Clapacs, Jackquline Denmark, MD   50 mg at 10/09/19 2143   PTA Medications: No medications prior to admission.    Patient Stressors: Substance abuse  Patient Strengths: Average or above average intelligence Capable of independent living Communication skills Physical Health  Treatment Modalities: Medication Management, Group therapy, Case management,  1 to 1 session with clinician, Psychoeducation, Recreational therapy.   Physician Treatment Plan for Primary Diagnosis: <principal problem not specified> Long Term Goal(s):     Short Term Goals:    Medication Management: Evaluate patient's response, side effects, and tolerance of medication regimen.  Therapeutic Interventions: 1 to 1 sessions, Unit Group sessions and Medication administration.  Evaluation of Outcomes: Progressing  Physician Treatment Plan for Secondary Diagnosis: Active Problems:   Bipolar depression (HCC)  Long Term  Goal(s):     Short Term Goals:       Medication Management: Evaluate patient's response, side effects, and tolerance of medication regimen.  Therapeutic Interventions: 1 to 1 sessions, Unit Group sessions and Medication administration.  Evaluation of Outcomes: Progressing   RN Treatment Plan for Primary Diagnosis: <principal problem not specified> Long Term Goal(s): Knowledge of disease and therapeutic regimen to maintain health will improve  Short Term Goals: Ability to remain free from injury will improve, Ability to demonstrate self-control, Ability to disclose and discuss suicidal ideas, Ability to identify and develop effective coping behaviors will improve and Compliance with prescribed medications will improve  Medication Management: RN will administer medications as ordered by provider, will assess and evaluate patient's response and provide education to patient for prescribed medication. RN will report any adverse and/or side effects to prescribing provider.  Therapeutic Interventions: 1 on 1 counseling sessions, Psychoeducation, Medication administration, Evaluate responses to treatment, Monitor vital signs and CBGs as ordered, Perform/monitor CIWA, COWS, AIMS and Fall Risk screenings as ordered, Perform wound care treatments as ordered.  Evaluation of Outcomes: Progressing   LCSW Treatment Plan for Primary Diagnosis: <principal problem not specified> Long Term Goal(s): Safe transition to appropriate next level of care at discharge, Engage patient in therapeutic group addressing interpersonal concerns.  Short Term Goals: Engage patient in aftercare planning with referrals and resources, Increase social support, Facilitate acceptance of mental health diagnosis and concerns and Increase skills for wellness and recovery  Therapeutic Interventions: Assess for all discharge needs, 1 to 1 time with Social worker, Explore available resources and support systems, Assess for adequacy in  community  support network, Educate family and significant other(s) on suicide prevention, Complete Psychosocial Assessment, Interpersonal group therapy.  Evaluation of Outcomes: Progressing   Progress in Treatment: Attending groups: Yes. Participating in groups: Yes. Taking medication as prescribed: Yes. Toleration medication: Yes. Family/Significant other contact made: No, will contact:  pts sister Patient understands diagnosis: Yes. Discussing patient identified problems/goals with staff: Yes. Medical problems stabilized or resolved: Yes. Denies suicidal/homicidal ideation: Yes. Issues/concerns per patient self-inventory: No. Other: N/A  New problem(s) identified: No, Describe:  none  New Short Term/Long Term Goal(s): Detox, elimination of AVH/symptoms of psychosis, medication management for mood stabilization; elimination of SI thoughts; development of comprehensive mental wellness/sobriety plan.   Patient Goals:  "To not be lost and anxiety"  Discharge Plan or Barriers: SPE pamphlet, Mobile Crisis information, and AA/NA information provided to patient for additional community support and resources. Pt declines referrals for follow up.  Reason for Continuation of Hospitalization: Anxiety Depression Medication stabilization Suicidal ideation  Estimated Length of Stay: 3-5 days  Recreational Therapy: Patient Stressors: N/A Patient Goal: Patient will engage in groups without prompting or encouragement from LRT x3 group sessions within 5 recreation therapy group sessions  Attendees: Patient: Tom Smith 10/10/2019 1:45 PM  Physician: Dr Weber Cooks MD 10/10/2019 1:45 PM  Nursing: Lyda Kalata, RN 10/10/2019 1:45 PM  RN Care Manager: 10/10/2019 1:45 PM  Social Worker: Minette Brine Moton LCSW 10/10/2019 1:45 PM  Recreational Therapist: Roanna Epley CTRS LRT 10/10/2019 1:45 PM  Other:  10/10/2019 1:45 PM  Other:  10/10/2019 1:45 PM  Other: 10/10/2019 1:45 PM    Scribe for Treatment  Team: Mariann Laster Moton, LCSW 10/10/2019 1:45 PM

## 2019-10-10 NOTE — Plan of Care (Signed)
Patient aware of information received  regarding [facility] Education , able to verbalize understanding . Denies suicidal ideations . Attending unit programing , able to verbalize feeling. Working on coping, anxiety and decision making.                                Problem: Education: Goal: Knowledge of [facility] Education information/materials will improve Outcome: Progressing Goal: Emotional status will improve Outcome: Progressing Goal: Mental status will improve Outcome: Progressing Goal: Verbalization of understanding the information provided will improve Outcome: Progressing   Problem: Activity: Goal: Interest or engagement in activities will improve Outcome: Progressing Goal: Sleeping patterns will improve Outcome: Progressing   Problem: Health Behavior/Discharge Planning: Goal: Identification of resources available to assist in meeting health care needs will improve Outcome: Progressing Goal: Compliance with treatment plan for underlying cause of condition will improve Outcome: Progressing   Problem: Physical Regulation: Goal: Ability to maintain clinical measurements within normal limits will improve Outcome: Progressing   Problem: Safety: Goal: Periods of time without injury will increase Outcome: Progressing   Problem: Education: Goal: Utilization of techniques to improve thought processes will improve Outcome: Progressing Goal: Knowledge of the prescribed therapeutic regimen will improve Outcome: Progressing   Problem: Activity: Goal: Interest or engagement in leisure activities will improve Outcome: Progressing Goal: Imbalance in normal sleep/wake cycle will improve Outcome: Progressing   Problem: Coping: Goal: Coping ability will improve Outcome: Progressing Goal: Will verbalize feelings Outcome: Progressing   Problem: Health Behavior/Discharge Planning: Goal: Ability to make decisions will improve Outcome: Progressing Goal: Compliance  with therapeutic regimen will improve Outcome: Progressing   Problem: Role Relationship: Goal: Will demonstrate positive changes in social behaviors and relationships Outcome: Progressing   Problem: Safety: Goal: Ability to disclose and discuss suicidal ideas will improve Outcome: Progressing Goal: Ability to identify and utilize support systems that promote safety will improve Outcome: Progressing   Problem: Self-Concept: Goal: Will verbalize positive feelings about self Outcome: Progressing Goal: Level of anxiety will decrease Outcome: Progressing

## 2019-10-10 NOTE — [facility] Group Notes (Signed)
[facility] Group Notes:  (Nursing/MHT/Case Management/Adjunct)  Date:  10/10/2019  Time:  2:53 PM  Type of Therapy:  Psychoeducational Skills  Participation Level:  Active  Participation Quality:  Appropriate, Attentive and Sharing  Affect:  Appropriate  Cognitive:  Alert  Insight:  Appropriate  Engagement in Group:  Engaged  Modes of Intervention:  Activity, Discussion and Education  Summary of Progress/Problems:  Lynelle Smoke [facility] 10/10/2019, 2:53 PM

## 2019-10-10 NOTE — BHH Suicide Risk Assessment (Signed)
[facility] Admission Suicide Risk Assessment   Nursing information obtained from:  Patient, Review of record Demographic factors:  Male, Caucasian Current Mental Status:  Suicidal ideation indicated by patient, Suicidal ideation indicated by others Loss Factors:  Loss of significant relationship Historical Factors:  Family history of mental illness or substance abuse Risk Reduction Factors:  Living with another person, especially a relative  Total Time spent with patient: 1 hour Principal Problem: Bipolar depression (HCC) Diagnosis:  Principal Problem:   Bipolar depression (HCC) Active Problems:   Benzodiazepine overdose  Subjective Data: Patient seen chart reviewed.  54 year old man who made a suicide attempt with overdose of Xanax and continues to report symptoms of extreme depression hopelessness negativity and suicidal ideation without specific plan.  Denies psychotic symptoms.  Shows reasonably good insight.  Continued Clinical Symptoms:  Alcohol Use Disorder Identification Test Final Score (AUDIT): 0 The "Alcohol Use Disorders Identification Test", Guidelines for Use in Primary Care, Second Edition.  World Science writer [facility]). Score between 0-7:  no or low risk or alcohol related problems. Score between 8-15:  moderate risk of alcohol related problems. Score between 16-19:  high risk of alcohol related problems. Score 20 or above:  warrants further diagnostic evaluation for alcohol dependence and treatment.   CLINICAL FACTORS:   Bipolar Disorder:   Depressive phase Depression:   Impulsivity   Musculoskeletal: Strength & Muscle Tone: within normal limits Gait & Station: normal Patient leans: N/A  Psychiatric Specialty Exam: Physical Exam  Nursing note and vitals reviewed. Constitutional: He appears well-developed and well-nourished.  HENT:  Head: Normocephalic and atraumatic.  Eyes: Pupils are equal, round, and reactive to light. Conjunctivae are normal.   Cardiovascular: Normal heart sounds.  Respiratory: Effort normal.  GI: Soft.  Musculoskeletal:        General: Normal range of motion.     Cervical back: Normal range of motion.  Neurological: He is alert.  Skin: Skin is warm and dry.  Psychiatric: His mood appears anxious. His speech is delayed. He is withdrawn. Thought content is not paranoid. He expresses impulsivity. He exhibits a depressed mood. He expresses suicidal ideation. He expresses no suicidal plans. He exhibits abnormal recent memory.    Review of Systems  Constitutional: Negative.   HENT: Negative.   Eyes: Negative.   Respiratory: Negative.   Cardiovascular: Negative.   Gastrointestinal: Negative.   Musculoskeletal: Negative.   Skin: Negative.   Neurological: Negative.   Psychiatric/Behavioral: Positive for dysphoric mood, self-injury and suicidal ideas.    Blood pressure 115/87, pulse 91, temperature 98 F (36.7 C), temperature source Oral, resp. rate 18, height 5\' 10"  (1.778 m), weight 68.5 kg, SpO2 99 %.Body mass index is 21.67 kg/m.  General Appearance: Casual  Eye Contact:  Fair  Speech:  Normal Rate  Volume:  Normal  Mood:  Depressed  Affect:  Congruent  Thought Process:  Coherent  Orientation:  Full (Time, Place, and Person)  Thought Content:  Logical, Rumination and Tangential  Suicidal Thoughts:  Yes.  without intent/plan  Homicidal Thoughts:  No  Memory:  Immediate;   Fair Recent;   Poor Remote;   Fair  Judgement:  Fair  Insight:  Fair  Psychomotor Activity:  Decreased  Concentration:  Concentration: Fair  Recall:  of Knowledge:  Fair  Language:  Fair  Akathisia:  No  Handed:  Right  AIMS (if indicated):     Assets:  Desire for Improvement Resilience  ADL's:  Intact  Cognition:  WNL  Sleep:  Number of Hours: 7.5      COGNITIVE FEATURES THAT CONTRIBUTE TO RISK:  Thought constriction (tunnel vision)    SUICIDE RISK:   Moderate:  Frequent suicidal ideation with limited  intensity, and duration, some specificity in terms of plans, no associated intent, good self-control, limited dysphoria/symptomatology, some risk factors present, and identifiable protective factors, including available and accessible social support.  PLAN OF CARE: Continue 15-minute checks.  Increased dose of Seroquel for treatment of bipolar depression.  Engage in individual and group therapy.  Educate patient and work on plans for discharge.  I certify that inpatient services furnished can reasonably be expected to improve the patient's condition.   Alethia Berthold, MD 10/10/2019, 3:56 PM

## 2019-10-10 NOTE — BHH Group Notes (Signed)
[facility] Group Notes:  (Nursing/MHT/Case Management/Adjunct)  Date:  10/10/2019  Time:  9:38 PM  Type of Therapy:  Group Therapy  Participation Level:  Active  Participation Quality:  Appropriate, Sharing and Supportive  Affect:  Appropriate  Cognitive:  Alert and Appropriate  Insight:  Appropriate  Engagement in Group:  Engaged and Supportive  Modes of Intervention:  Education  Summary of Progress/Problems:  Tom Smith 10/10/2019, 9:38 PM

## 2019-10-10 NOTE — Progress Notes (Signed)
Recreation Therapy Notes   Date: 10/10/2019  Time: 9:30 am  Location: Craft Room  Behavioral response: Appropriate   Intervention Topic: Goals   Discussion/Intervention:  Group content on today was focused on goals. Patients described what goals are and how they define goals. Individuals expressed how they go about setting goals and reaching them. The group identified how important goals are and if they make short term goals to reach long term goals. Patients described how many goals they work on at a time and what affects them not reaching their goal. Individuals described how much time they put into planning and obtaining their goals. The group participated in the intervention "My Goal Board" and made personal goal boards to help them achieve their goal. Clinical Observations/Feedback:  Patient came to group and was focused on what peers and staff had to say about goals. Individual was social with peers and staff while participating in the intervention during group.  Richrd Kuzniar LRT/CTRS         Neftaly Inzunza 10/10/2019 12:45 PM

## 2019-10-10 NOTE — H&P (Signed)
Psychiatric Admission Assessment Adult  Patient Identification: Tom Smith. MRN:  [id number] Date of Evaluation:  10/10/2019 Chief Complaint:  Bipolar depression (HCC) [F31.9] Principal Diagnosis: Bipolar depression (HCC) Diagnosis:  Principal Problem:   Bipolar depression (HCC) Active Problems:   Benzodiazepine overdose  History of Present Illness: Patient seen and chart reviewed.  This is a 54 year old man who was brought to the emergency room after taking a large overdose of Xanax.  On initial presentation he was agitated and delirious and required sedation before he could be reexamined.  Patient admits that he was trying to kill himself and describes his recent mood is being hopeless and like he has "already died".  Apparently this is been going on for months.  He has felt no interest in any of his normal activities and no motivation to do anything.  Patient denies that he regularly abuses drugs saying that he stopped drinking this past summer.  He was drinking at that time during a spell of severe mania and says that after the mania ended he crashed into his current depression.  He is not currently receiving any outpatient psychiatric treatments.  He does not report any current hallucinations denies homicidal ideation. Associated Signs/Symptoms: Depression Symptoms:  depressed mood, anhedonia, insomnia, psychomotor retardation, fatigue, feelings of worthlessness/guilt, difficulty concentrating, hopelessness, recurrent thoughts of death, suicidal attempt, (Hypo) Manic Symptoms:  None reported Anxiety Symptoms:  Excessive Worry, Psychotic Symptoms:  Not reporting any PTSD Symptoms: Negative Total Time spent with patient: 1 hour  Past Psychiatric History: Patient is somewhat evasive about this.  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  He describes what probably sounds like a psychotic manic episode then.  Evidently after leaving the  service he claims he never had any follow-up psychiatric treatment.  He says he never realized he had bipolar disorder until the manic episode he had this summer.  He does have a past history of suicide attempts including a recent history of suicide attempt.  Evidently does not know of any previous medicines he has been on.  Used to have a drinking problem but claims to have stopped months ago and not be abusing any drugs  Is the patient at risk to self? Yes.    Has the patient been a risk to self in the past 6 months? Yes.    Has the patient been a risk to self within the distant past? No.  Is the patient a risk to others? No.  Has the patient been a risk to others in the past 6 months? No.  Has the patient been a risk to others within the distant past? No.   Prior Inpatient Therapy:   Prior Outpatient Therapy:    Alcohol Screening: 1. How often do you have a drink containing alcohol?: Never 2. How many drinks containing alcohol do you have on a typical day when you are drinking?: 1 or 2 3. How often do you have six or more drinks on one occasion?: Never AUDIT-C Score: 0 4. How often during the last year have you found that you were not able to stop drinking once you had started?: Never 5. How often during the last year have you failed to do what was normally expected from you becasue of drinking?: Never 6. How often during the last year have you needed a first drink in the morning to get yourself going after a heavy drinking session?: Never 7. How often during the last year  have you had a feeling of guilt of remorse after drinking?: Never 8. How often during the last year have you been unable to remember what happened the night before because you had been drinking?: Never 9. Have you or someone else been injured as a result of your drinking?: No 10. Has a relative or friend or a doctor or another health worker been concerned about your drinking or suggested you cut down?: No Alcohol Use  Disorder Identification Test Final Score (AUDIT): 0 Alcohol Brief Interventions/Follow-up: Brief Advice Substance Abuse History in the last 12 months:  Yes.   Consequences of Substance Abuse: Negative Previous Psychotropic Medications: No  Psychological Evaluations: Yes  Past Medical History:  Past Medical History:  Diagnosis Date  . Psoriatic arthritis ([place])    History reviewed. No pertinent surgical history. Family History: History reviewed. No pertinent family history. Family Psychiatric  History: Sister is reported to have bipolar disorder Tobacco Screening: Have you used any form of tobacco in the last 30 days? (Cigarettes, Smokeless Tobacco, Cigars, and/or Pipes): Yes Tobacco use, Select all that apply: 5 or more cigarettes per day Are you interested in Tobacco Cessation Medications?: Yes, will notify MD for an order Counseled patient on smoking cessation including recognizing danger situations, developing coping skills and basic information about quitting provided: Yes Social History:  Social History   Substance and Sexual Activity  Alcohol Use Not Currently   Comment: UTA     Social History   Substance and Sexual Activity  Drug Use Yes   Comment: Overdosed on prescription pills 3/7    Additional Social History: Marital status: Married Number of Years Married: 25 What types of issues is patient dealing with in the relationship?: Pt reports his wife stated she was going to divorce him, but he hasnt been served with any papers yet Does patient have children?: Yes How many children?: 1 How is patient's relationship with their children?: estranged due to distance                         Allergies:  No Known Allergies Lab Results:  Results for orders placed or performed during the hospital encounter of 10/08/19 (from the past 72 hour(s))  Urine Drug Screen, Qualitative ([place] only)     Status: Abnormal   Collection Time: 10/08/19  7:11 PM  Result Value Ref  Range   Tricyclic, Ur Screen NONE DETECTED NONE DETECTED   Amphetamines, Ur Screen NONE DETECTED NONE DETECTED   MDMA (Ecstasy)Ur Screen NONE DETECTED NONE DETECTED   Cocaine Metabolite,Ur [place] NONE DETECTED NONE DETECTED   Opiate, Ur Screen NONE DETECTED NONE DETECTED   Phencyclidine (PCP) Ur S NONE DETECTED NONE DETECTED   Cannabinoid 50 Ng, Ur  NONE DETECTED NONE DETECTED   Barbiturates, Ur Screen NONE DETECTED NONE DETECTED   Benzodiazepine, Ur Scrn POSITIVE (A) NONE DETECTED   Methadone Scn, Ur NONE DETECTED NONE DETECTED    Comment: (NOTE) Tricyclics + metabolites, urine    Cutoff 1000 ng/mL Amphetamines + metabolites, urine  Cutoff 1000 ng/mL MDMA (Ecstasy), urine              Cutoff 500 ng/mL Cocaine Metabolite, urine          Cutoff 300 ng/mL Opiate + metabolites, urine        Cutoff 300 ng/mL Phencyclidine (PCP), urine         Cutoff 25 ng/mL Cannabinoid, urine  Cutoff 50 ng/mL Barbiturates + metabolites, urine  Cutoff 200 ng/mL Benzodiazepine, urine              Cutoff 200 ng/mL Methadone, urine                   Cutoff 300 ng/mL The urine drug screen provides only a preliminary, unconfirmed analytical test result and should not be used for non-medical purposes. Clinical consideration and professional judgment should be applied to any positive drug screen result due to possible interfering substances. A more specific alternate chemical method must be used in order to obtain a confirmed analytical result. Gas chromatography / mass spectrometry (GC/MS) is the preferred confirmat ory method. Performed at [facility], [address] [place], [place] [address]   Urinalysis, Routine w reflex microscopic     Status: Abnormal   Collection Time: 10/08/19  7:11 PM  Result Value Ref Range   Color, Urine STRAW (A) YELLOW   APPearance CLEAR (A) CLEAR   Specific Gravity, Urine 1.003 (L) 1.005 - 1.030   pH 7.0 5.0 - 8.0   Glucose, UA NEGATIVE NEGATIVE  mg/dL   Hgb urine dipstick NEGATIVE NEGATIVE   Bilirubin Urine NEGATIVE NEGATIVE   Ketones, ur NEGATIVE NEGATIVE mg/dL   Protein, ur NEGATIVE NEGATIVE mg/dL   Nitrite NEGATIVE NEGATIVE   Leukocytes,Ua NEGATIVE NEGATIVE    Comment: Performed at [facility], [address] [place], [place] [address]  Acetaminophen level     Status: Abnormal   Collection Time: 10/08/19  7:11 PM  Result Value Ref Range   Acetaminophen (Tylenol), Serum <10 (L) 10 - 30 ug/mL    Comment: (NOTE) Therapeutic concentrations vary significantly. A range of 10-30 ug/mL  may be an effective concentration for many patients. However, some  are best treated at concentrations outside of this range. Acetaminophen concentrations >150 ug/mL at 4 hours after ingestion  and >50 ug/mL at 12 hours after ingestion are often associated with  toxic reactions. Performed at [facility], [address] [place], [place] [address]   Salicylate level     Status: Abnormal   Collection Time: 10/08/19  7:11 PM  Result Value Ref Range   Salicylate Lvl <7.0 (L) 7.0 - 30.0 mg/dL    Comment: Performed at [facility], [address] [place], [place] [address]  Respiratory Panel by RT PCR (Flu A&B, Covid) - Nasopharyngeal Swab     Status: None   Collection Time: 10/09/19  8:21 AM   Specimen: Nasopharyngeal Swab  Result Value Ref Range   SARS Coronavirus 2 by RT PCR NEGATIVE NEGATIVE    Comment: (NOTE) SARS-CoV-2 target nucleic acids are NOT DETECTED. The SARS-CoV-2 RNA is generally detectable in upper respiratoy specimens during the acute phase of infection. The lowest concentration of SARS-CoV-2 viral copies this assay can detect is 131 copies/mL. A negative result does not preclude SARS-Cov-2 infection and should not be used as the sole basis for treatment or other patient management decisions. A negative result may occur with  improper specimen collection/handling, submission of specimen  other than nasopharyngeal swab, presence of viral mutation(s) within the areas targeted by this assay, and inadequate number of viral copies (<131 copies/mL). A negative result must be combined with clinical observations, patient history, and epidemiological information. The expected result is Negative. Fact Sheet for Patients:  https://www.moore.com/ Fact Sheet for Healthcare Providers:  https://www.young.biz/ This test is not yet ap proved or cleared by the [place] and  has been authorized for detection and/or diagnosis of SARS-CoV-2 by FDA under an Emergency Use Authorization (EUA). This EUA will remain  in effect (meaning this test can be used) for the duration of the COVID-19 declaration under Section 564(b)(1) of the Act, 21 U.S.C. section 360bbb-3(b)(1), unless the authorization is terminated or revoked sooner.    Influenza A by PCR NEGATIVE NEGATIVE   Influenza B by PCR NEGATIVE NEGATIVE    Comment: (NOTE) The Xpert Xpress SARS-CoV-2/FLU/RSV assay is intended as an aid in  the diagnosis of influenza from Nasopharyngeal swab specimens and  should not be used as a sole basis for treatment. Nasal washings and  aspirates are unacceptable for Xpert Xpress SARS-CoV-2/FLU/RSV  testing. Fact Sheet for Patients: https://www.moore.com/https://www.fda.gov/media/142436/download Fact Sheet for Healthcare Providers: https://www.young.biz/https://www.fda.gov/media/142435/download This test is not yet approved or cleared by the [place] FDA and  has been authorized for detection and/or diagnosis of SARS-CoV-2 by  FDA under an Emergency Use Authorization (EUA). This EUA will remain  in effect (meaning this test can be used) for the duration of the  Covid-19 declaration under Section 564(b)(1) of the Act, 21  U.S.C. section 360bbb-3(b)(1), unless the authorization is  terminated or revoked. Performed at [facility], [address] [place], [place] [id number]      Blood Alcohol level:  Lab Results  Component Value Date   [facility]    Metabolic Disorder Labs:  No results found for: HGBA1C, MPG No results found for: PROLACTIN No results found for: CHOL, TRIG, HDL, CHOLHDL, VLDL, LDLCALC  Current Medications: Current Facility-Administered Medications  Medication Dose Route Frequency Provider Last Rate Last Admin  . acetaminophen (TYLENOL) tablet 650 mg  650 mg Oral Q6H PRN Waynesha Rammel, Jackquline DenmarkJohn T, MD   650 mg at 10/10/19 0028  . alum & mag hydroxide-simeth (MAALOX/MYLANTA) 200-200-20 MG/5ML suspension 30 mL  30 mL Oral Q4H PRN Swan Zayed T, MD      . influenza vac split quadrivalent PF (FLUARIX) injection 0.5 mL  0.5 mL Intramuscular Tomorrow-1000 Jaesean Litzau T, MD      . magnesium hydroxide (MILK OF MAGNESIA) suspension 30 mL  30 mL Oral Daily PRN Anneta Rounds T, MD      . pneumococcal 23 valent vaccine (PNEUMOVAX-23) injection 0.5 mL  0.5 mL Intramuscular Tomorrow-1000 Benjy Kana T, MD      . QUEtiapine (SEROQUEL) tablet 100 mg  100 mg Oral QHS Shardai Star T, MD       PTA Medications: No medications prior to admission.    Musculoskeletal: Strength & Muscle Tone: within normal limits Gait & Station: normal Patient leans: N/A  Psychiatric Specialty Exam: Physical Exam  Nursing note and vitals reviewed. Constitutional: He appears well-developed and well-nourished.  HENT:  Head: Normocephalic and atraumatic.  Eyes: Pupils are equal, round, and reactive to light. Conjunctivae are normal.  Cardiovascular: Regular rhythm and normal heart sounds.  Respiratory: Effort normal. No respiratory distress.  GI: Soft.  Musculoskeletal:        General: Normal range of motion.     Cervical back: Normal range of motion.  Neurological: He is alert.  Skin: Skin is warm and dry.  Psychiatric: His speech is delayed. He is slowed. Cognition and memory are normal. He expresses impulsivity. He exhibits a depressed mood. He expresses  suicidal ideation. He expresses no suicidal plans.    Review of Systems  Constitutional: Negative.   HENT: Negative.   Eyes: Negative.   Respiratory: Negative.   Cardiovascular: Negative.  Gastrointestinal: Negative.   Musculoskeletal: Negative.   Skin: Negative.   Neurological: Negative.   Psychiatric/Behavioral: Positive for dysphoric mood and suicidal ideas.    Blood pressure 115/87, pulse 91, temperature 98 F (36.7 C), temperature source Oral, resp. rate 18, height 5\' 10"  (1.778 m), weight 68.5 kg, SpO2 99 %.Body mass index is 21.67 kg/m.  General Appearance: Casual  Eye Contact:  Fair  Speech:  Slow  Volume:  Decreased  Mood:  Depressed and Dysphoric  Affect:  Depressed  Thought Process:  Coherent  Orientation:  Full (Time, Place, and Person)  Thought Content:  Logical  Suicidal Thoughts:  Yes.  without intent/plan  Homicidal Thoughts:  No  Memory:  Immediate;   Fair Recent;   Fair Remote;   Fair  Judgement:  Fair  Insight:  Shallow  Psychomotor Activity:  Normal  Concentration:  Concentration: Fair  Recall:  of Knowledge:  Fair  Language:  Fair  Akathisia:  No  Handed:  Right  AIMS (if indicated):     Assets:  Desire for Improvement Housing Physical Health Resilience  ADL's:  Impaired  Cognition:  WNL  Sleep:  Number of Hours: 7.5    Treatment Plan Summary: Daily contact with patient to assess and evaluate symptoms and progress in treatment, Medication management and Plan Reviewed labs.  Psychoeducation about bipolar disorder.  Start Seroquel to help with sleep and with bipolar depression.  Discuss son with him and he is currently agreeable.  Encourage attendance at groups.  We will try and get some collateral information particularly if possible from his family before arranging for appropriate outpatient treatment.  Observation Level/Precautions:  15 minute checks  Laboratory:  Chemistry Profile  Psychotherapy:    Medications:     Consultations:    Discharge Concerns:    Estimated LOS:  Other:     Physician Treatment Plan for Primary Diagnosis: Bipolar depression (HCC) Long Term Goal(s): Improvement in symptoms so as ready for discharge  Short Term Goals: Ability to disclose and discuss suicidal ideas and Ability to demonstrate self-control will improve  Physician Treatment Plan for Secondary Diagnosis: Principal Problem:   Bipolar depression (HCC) Active Problems:   Benzodiazepine overdose  Long Term Goal(s): Improvement in symptoms so as ready for discharge  Short Term Goals: Ability to maintain clinical measurements within normal limits will improve and Compliance with prescribed medications will improve  I certify that inpatient services furnished can reasonably be expected to improve the patient's condition.    [employer], MD 3/9/20213:59 PM

## 2019-10-10 NOTE — Progress Notes (Signed)
D: Bipolar Depression   A: Patient stated slept fair last night .Stated appetite  good and energy level  low. Stated concentration poor . Stated on Depression scale 5 , hopeless 8 and anxiety 8 .( low 0-10 high) Denies suicidal  homicidal ideations  .  No auditory hallucinations  No pain concerns . Appropriate ADL'S. Interacting with peers and staff.   Encourage patient participation with unit programming . Instruction  Given on  Medication , verbalize understanding.Patient aware of information received  regarding [facility] Education , able to verbalize understanding . Denies suicidal ideations . Attending unit programing , able to verbalize feeling. Working on coping, anxiety and decision making.       R: Voice no other concerns. Staff continue to monitor

## 2019-10-10 NOTE — [facility] Suicide Risk Assessment (Signed)
[facility] INPATIENT:  Family/Significant Other Suicide Prevention Education  Suicide Prevention Education:  Contact Attempts: Viviann Spare, sister [phone] has been identified by the patient as the family member/significant other with whom the patient will be residing, and identified as the person(s) who will aid the patient in the event of a mental health crisis.  With written consent from the patient, two attempts were made to provide suicide prevention education, prior to and/or following the patient's discharge.  We were unsuccessful in providing suicide prevention education.  A suicide education pamphlet was given to the patient to share with family/significant other.  Date and time of first attempt: 10/10/19 at 1:17pm Date and time of second attempt:  CSW left a HIPAA compliant voicemail requesting a call back.   Charlann Lange Makenleigh Crownover 10/10/2019, 1:18 PM

## 2019-10-11 NOTE — [facility] Suicide Risk Assessment (Signed)
[facility] INPATIENT:  Family/Significant Other Suicide Prevention Education  Suicide Prevention Education:  Contact Attempts: Viviann Spare, sister [phone] has been identified by the patient as the family member/significant other with whom the patient will be residing, and identified as the person(s) who will aid the patient in the event of a mental health crisis.  With written consent from the patient, two attempts were made to provide suicide prevention education, prior to and/or following the patient's discharge.  We were unsuccessful in providing suicide prevention education.  A suicide education pamphlet was given to the patient to share with family/significant other.  Date and time of first attempt: 10/10/19 at 1:17pm Date and time of second attempt: 10/11/2019 at 12PM  [place] Mo 10/11/2019, 12:08 PM

## 2019-10-11 NOTE — Progress Notes (Signed)
[facility] MD Progress Note  10/11/2019 9:44 AM Tom Smith.  MRN:  [id number] Subjective:    Patient and seen and case discussed during treatment team.  Patient is seen in the dayroom engaging well with his peers and reading a magazine. He is alert and oriented at this time, and appropriate with Probation officer. He reports that his mood has improved and he is now missing his children. He is now future oriented and seems to be minimizing his symptoms of depression and recent suicide attempt. He is observed to be smiling, euthymic and laughing at his peers.He currently rates his depression 5/10 with 10 being the worse, and his affect is incongruent with his reported mood as noted above he is laughing smiling and be jokingly playful. He reports compliance with taking his medications. He was started on Seroquel and verbalizes this was started for my Bipolar. I had a manic episode that started back in 2017. He states he slept well and is eating well with no disturbances. He denies si/hi/avh at this time.   Principal Problem: Bipolar depression ([place]) Diagnosis: Principal Problem:   Bipolar depression ([place]) Active Problems:   Benzodiazepine overdose  Total Time spent with patient: 30 minutes  Past Psychiatric History:Patient is somewhat evasive about this.  He will admit that [place]  He describes what probably sounds like a psychotic manic episode then.  Evidently after leaving the service he claims he never had any follow-up psychiatric treatment.  He says he never realized he had bipolar disorder until the manic episode he had this summer.  He does have a past history of suicide attempts including a recent history of suicide attempt.  Evidently does not know of any previous medicines he has been on.  Used to have a drinking problem but claims to have stopped months ago and not be abusing any drugs   Past Medical History:  Past Medical History:  Diagnosis  Date  . Psoriatic arthritis ([place])    History reviewed. No pertinent surgical history. Family History: History reviewed. No pertinent family history. Family Psychiatric  History: Sister is reported to have bipolar disorder Social History:  Social History   Substance and Sexual Activity  Alcohol Use Not Currently   Comment: UTA     Social History   Substance and Sexual Activity  Drug Use Yes   Comment: Overdosed on prescription pills 3/7    Social History   Socioeconomic History  . Marital status: Divorced    Spouse name: Not on file  . Number of children: Not on file  . Years of education: Not on file  . Highest education level: Not on file  Occupational History  . Not on file  Tobacco Use  . Smoking status: Current Every Day Smoker    Packs/day: 1.00    Types: Cigarettes  . Smokeless tobacco: Never Used  Substance and Sexual Activity  . Alcohol use: Not Currently    Comment: UTA  . Drug use: Yes    Comment: Overdosed on prescription pills 3/7  . Sexual activity: Not on file  Other Topics Concern  . Not on file  Social History Narrative  . Not on file   Social Determinants of Health   Financial Resource Strain:   . Difficulty of Paying Living Expenses: Not on file  Food Insecurity:   . Worried About Charity fundraiser in the Last Year: Not on file  . Ran Out of Food  in the Last Year: Not on file  Transportation Needs:   . Lack of Transportation (Medical): Not on file  . Lack of Transportation (Non-Medical): Not on file  Physical Activity:   . Days of Exercise per Week: Not on file  . Minutes of Exercise per Session: Not on file  Stress:   . Feeling of Stress : Not on file  Social Connections:   . Frequency of Communication with Friends and Family: Not on file  . Frequency of Social Gatherings with Friends and Family: Not on file  . Attends Religious Services: Not on file  . Active Member of Clubs or Organizations: Not on file  . Attends Tax inspector Meetings: Not on file  . Marital Status: Not on file   Additional Social History:                         Sleep: Fair  Appetite:  Fair  Current Medications: Current Facility-Administered Medications  Medication Dose Route Frequency Provider Last Rate Last Admin  . acetaminophen (TYLENOL) tablet 650 mg  650 mg Oral Q6H PRN Clapacs, Jackquline Denmark, MD   650 mg at 10/10/19 1859  . alum & mag hydroxide-simeth (MAALOX/MYLANTA) 200-200-20 MG/5ML suspension 30 mL  30 mL Oral Q4H PRN Clapacs, John T, MD      . influenza vac split quadrivalent PF (FLUARIX) injection 0.5 mL  0.5 mL Intramuscular Tomorrow-1000 Clapacs, John T, MD      . magnesium hydroxide (MILK OF MAGNESIA) suspension 30 mL  30 mL Oral Daily PRN Clapacs, John T, MD      . pneumococcal 23 valent vaccine (PNEUMOVAX-23) injection 0.5 mL  0.5 mL Intramuscular Tomorrow-1000 Clapacs, John T, MD      . QUEtiapine (SEROQUEL) tablet 100 mg  100 mg Oral QHS Clapacs, John T, MD   100 mg at 10/10/19 2147    Lab Results: No results found for this or any previous visit (from the past 48 hour(s)).  Blood Alcohol level:  Lab Results  Component Value Date   ETH <10 10/08/2019    Metabolic Disorder Labs: No results found for: HGBA1C, MPG No results found for: PROLACTIN No results found for: CHOL, TRIG, HDL, CHOLHDL, VLDL, LDLCALC  Physical Findings: AIMS: Facial and Oral Movements Muscles of Facial Expression: None, normal Lips and Perioral Area: None, normal Jaw: None, normal Tongue: None, normal,Extremity Movements Upper (arms, wrists, hands, fingers): None, normal Lower (legs, knees, ankles, toes): None, normal, Trunk Movements Neck, shoulders, hips: None, normal, Overall Severity Severity of abnormal movements (highest score from questions above): None, normal Incapacitation due to abnormal movements: None, normal Patient's awareness of abnormal movements (rate only patient's report): No Awareness, Dental  Status Current problems with teeth and/or dentures?: No Does patient usually wear dentures?: No  CIWA:    COWS:     Musculoskeletal: Strength & Muscle Tone: within normal limits Gait & Station: normal Patient leans: N/A  Psychiatric Specialty Exam: Physical Exam  Review of Systems  Blood pressure 116/76, pulse 79, temperature 97.8 F (36.6 C), temperature source Oral, resp. rate 17, height 5\' 10"  (1.778 m), weight 68.5 kg, SpO2 100 %.Body mass index is 21.67 kg/m.  General Appearance: Fairly Groomed  Eye Contact:  Fair  Speech:  Clear and Coherent and Normal Rate  Volume:  Normal  Mood:  reported depression  Affect:  Non-Congruent and euthymic  Thought Process:  Coherent, Linear and Descriptions of Associations: Circumstantial  Orientation:  Full (  Time, Place, and Person)  Thought Content:  Logical  Suicidal Thoughts:  No  Homicidal Thoughts:  No  Memory:  Immediate;   Fair Recent;   Fair  Judgement:  Intact  Insight:  Fair  Psychomotor Activity:  Normal  Concentration:  Concentration: Fair and Attention Span: Fair  Recall:  [employer] of Knowledge:  Fair  Language:  Fair  Akathisia:  No  Handed:  Right  AIMS (if indicated):     Assets:  Communication Skills Desire for Improvement Financial Resources/Insurance Physical Health Social Support  ADL's:  Intact  Cognition:  WNL  Sleep:  Number of Hours: 8.15     Treatment Plan Summary: Daily contact with patient to assess and evaluate symptoms and progress in treatment, Medication management and Plan Continue Seroquel 100mg  po daily for mood stabilziation.   , FNP 10/11/2019, 9:44 AM

## 2019-10-11 NOTE — Progress Notes (Signed)
Recreation Therapy Notes  INPATIENT RECREATION THERAPY ASSESSMENT  Patient Details Name: Tom Smith. MRN: [id number] DOB: March 16, 1966 Today's Date: 10/11/2019       Information Obtained From: Patient  Able to Participate in Assessment/Interview: Yes  Patient Presentation: Responsive  Reason for Admission (Per Patient): Active Symptoms, Suicidal Ideation  Patient Stressors:    Coping Skills:   Talk  Leisure Interests (2+):  Nature - Hiking  Frequency of Recreation/Participation:    Awareness of Community Resources:     [employer]:     Current Use:    If no, Barriers?:    Expressed Interest in [employer] Information:    [employer] of Residence:  [place]  Patient Main Form of Transportation: Therapist, music  Patient Strengths:  N/A  Patient Identified Areas of Improvement:  N/A  Patient Goal for Hospitalization:  N/A  Current SI (including self-harm):  No  Current HI:  No  Current AVH: No  Staff Intervention Plan: Group Attendance, Collaborate with Interdisciplinary Treatment Team  Consent to Intern Participation: N/A  Nox Talent 10/11/2019, 3:17 PM

## 2019-10-11 NOTE — Progress Notes (Signed)
Patient alert and oriented x 4, affect is flat but he brightens upon approach he denies SI/HI/AVH interacting appropriately with peers and staff. Patient was complaint with medication regimen, no distress noted, 15 minutes safety checks maintained will continue to monitor patient attended evening wrap up group.

## 2019-10-11 NOTE — Progress Notes (Signed)
Recreation Therapy Notes  Date: 10/11/2019  Time: 9:30 am  Location: Craft Room  Behavioral response: Appropriate   Intervention Topic: Problem Solving   Discussion/Intervention:  Group content on today was focused on problem solving. The group described what problem solving is. Patients expressed how problems affect them and how they deal with problems. Individuals identified healthy ways to deal with problems. Patients explained what normally happens to them when they do not deal with problems. The group expressed reoccurring problems for them. The group participated in the intervention "Ways to Solve problems" where patients were given a chance to explore different ways to solve problems.  Clinical Observations/Feedback:  Patient came to group and defined problem solving as defeat. Individual was social with peers and staff while participating in the intervention during group.  Javonta Gronau LRT/CTRS         Miria Cappelli 10/11/2019 12:15 PM

## 2019-10-11 NOTE — Progress Notes (Signed)
D:Bipolar Depression  A: Patient stated slept fair last night .Stated appetite good and energy level  normal. Stated concentration good . Stated on Depression scale 5 , hopeless 5 and anxiety 5 .( low 0-10 high) Denies suicidal  homicidal ideations  .  No auditory hallucinations  No pain concerns . Appropriate ADL'S. Interacting with peers and staff.  Encourage patient participation with unit programming . Instruction  Given on  Medication , verbalize understanding.Patient aware of information received  regarding [facility] Education , able to verbalize understanding . Denies suicidal ideations . Attending unit programing , able to verbalize feeling. Working on coping, anxiety and decision making  R: Voice no other concerns. Staff continue to monitor

## 2019-10-11 NOTE — [facility] Group Notes (Signed)
[facility] Group Notes:  (Nursing/MHT/Case Management/Adjunct)  Date:  10/11/2019  Time:  10:23 AM  Type of Therapy:  Community Meeting  Participation Level:  Active  Participation Quality:  Appropriate  Affect:  Appropriate  Cognitive:  Appropriate  Insight:  Appropriate  Engagement in Group:  Engaged  Modes of Intervention:  Discussion and Education  Summary of Progress/Problems:  Tom Smith 10/11/2019, 10:23 AM

## 2019-10-11 NOTE — [facility] Group Notes (Signed)
LCSW Group Therapy Note  10/11/2019 11:29 AM  Type of Therapy/Topic:  Group Therapy:  Emotion Regulation  Participation Level:  Minimal   Description of Group:   The purpose of this group is to assist patients in learning to regulate negative emotions and experience positive emotions. Patients will be guided to discuss ways in which they have been vulnerable to their negative emotions. These vulnerabilities will be juxtaposed with experiences of positive emotions or situations, and patients will be challenged to use positive emotions to combat negative ones. Special emphasis will be placed on coping with negative emotions in conflict situations, and patients will process healthy conflict resolution skills.  Therapeutic Goals: 1. Patient will identify two positive emotions or experiences to reflect on in order to balance out negative emotions 2. Patient will label two or more emotions that they find the most difficult to experience 3. Patient will demonstrate positive conflict resolution skills through discussion and/or role plays  Summary of Patient Progress: Pt was present in group but did not really engage in the group discussion.   Therapeutic Modalities:   Cognitive Behavioral Therapy Feelings Identification Dialectical Behavioral Therapy   Iris Pert, MSW, LCSW Clinical Social Work 10/11/2019 11:29 AM

## 2019-10-11 NOTE — Plan of Care (Addendum)
Patient aware of information received  regarding able to verbalize understanding . Denies suicidal ideations . Attending unit programing , able to verbalize feeling. Working on coping, anxiety and decision making  Problem: Activity: Goal: Interest or engagement in activities will improve Outcome: Progressing Goal: Sleeping patterns will improve Outcome: Progressing   Problem: Health Behavior/Discharge Planning: Goal: Identification of resources available to assist in meeting health care needs will improve Outcome: Progressing Goal: Compliance with treatment plan for underlying cause of condition will improve Outcome: Progressing   Problem: Physical Regulation: Goal: Ability to maintain clinical measurements within normal limits will improve Outcome: Progressing   Problem: Safety: Goal: Periods of time without injury will increase Outcome: Progressing   Problem: Education: Goal: Utilization of techniques to improve thought processes will improve Outcome: Progressing Goal: Knowledge of the prescribed therapeutic regimen will improve Outcome: Progressing   Problem: Activity: Goal: Interest or engagement in leisure activities will improve Outcome: Progressing Goal: Imbalance in normal sleep/wake cycle will improve Outcome: Progressing   Problem: Coping: Goal: Coping ability will improve Outcome: Progressing Goal: Will verbalize feelings Outcome: Progressing   Problem: Health Behavior/Discharge Planning: Goal: Ability to make decisions will improve Outcome: Progressing Goal: Compliance with therapeutic regimen will improve Outcome: Progressing   Problem: Role Relationship: Goal: Will demonstrate positive changes in social behaviors and relationships Outcome: Progressing   Problem: Safety: Goal: Ability to disclose and discuss suicidal ideas will improve Outcome: Progressing Goal: Ability to identify and utilize support systems that promote safety will improve Outcome:  Progressing   Problem: Self-Concept: Goal: Will verbalize positive feelings about self Outcome: Progressing Goal: Level of anxiety will decrease Outcome: Progressing  one Health Education , able to verbalize understanding . Denies suicidal ideations . Attending unit programing , able to verbalize feeling. Working on coping, anxiety and decision making Problem: Education: Goal: Knowledge of [place] [facility] Education information/materials will improve Outcome: Progressing Goal: Emotional status will improve Outcome: Progressing Goal: Mental status will improve Outcome: Progressing Goal: Verbalization of understanding the information provided will improve Outcome: Progressing

## 2019-10-12 MED ORDER — QUETIAPINE FUMARATE 200 MG PO TABS
200.0000 mg | ORAL_TABLET | Freq: Every day | ORAL | Status: DC
  Administered 2019-10-12 – 2019-10-13 (×2): 200 mg via ORAL
  Filled 2019-10-12 (×2): qty 1

## 2019-10-12 NOTE — Plan of Care (Signed)
Patient aware of information received  regarding able to verbalize understanding . Denies suicidal ideations . Attending unit programing , able to verbalize feeling. Working on coping, anxiety and decision making  Problem: Education: Goal: Knowledge of [place] [facility] Education information/materials will improve Outcome: Progressing Goal: Emotional status will improve Outcome: Progressing Goal: Mental status will improve Outcome: Progressing Goal: Verbalization of understanding the information provided will improve Outcome: Progressing   Problem: Activity: Goal: Interest or engagement in activities will improve Outcome: Progressing Goal: Sleeping patterns will improve Outcome: Progressing   Problem: Activity: Goal: Interest or engagement in leisure activities will improve Outcome: Progressing Goal: Imbalance in normal sleep/wake cycle will improve Outcome: Progressing   Problem: Coping: Goal: Coping ability will improve Outcome: Progressing Goal: Will verbalize feelings Outcome: Progressing   Problem: Health Behavior/Discharge Planning: Goal: Ability to make decisions will improve Outcome: Progressing Goal: Compliance with therapeutic regimen will improve Outcome: Progressing

## 2019-10-12 NOTE — Progress Notes (Signed)
Pt is alert and oriented to person, place and time.  Pt is calm, cooperative, makes good eye contact, affect appropriately bright, smiles on contact. Pt reports good insight into his illness and symptoms, and triggers for admission, reports he is now aware that he was manic prior to admission for, "a long time then I crashed and felt so depressed that I took an overdose of Xanax." Pt denies any current suicidal ideation, denies homicidal ideation, denies hallucinations. Pt rates both feelings of depression and anxiety a 5/10 on a 0-10 scale, 10 being worst. Pt is pleasant, and reports he has been medication complaint today and attended AM groups. Pt shows this Clinical research associate his life assessment tool on paper that he completed in group. Pt was provided emotional support, and education related to importance of outpatient follow up appointments and continued medication compliance. Will continue to monitor pt per Q15 minute face checks and monitor for safety and progress. No distress noted or reported.

## 2019-10-12 NOTE — Progress Notes (Signed)
[facility] MD Progress Note  10/12/2019 4:14 PM Tom Smith.  MRN:  [id number] Subjective: Patient seen chart reviewed.  Patient continues to endorse being depressed sad down and negative.  Stays withdrawn to himself much of the time.  Suicidal ideation has become much less prominent.  Denies any current hallucinations.  Sleeping a little bit better. Principal Problem: Bipolar depression ([place]) Diagnosis: Principal Problem:   Bipolar depression ([place]) Active Problems:   Benzodiazepine overdose  Total Time spent with patient: 30 minutes  Past Psychiatric History: Past history of depression and mania years ago  Past Medical History:  Past Medical History:  Diagnosis Date  . Psoriatic arthritis ([place])    History reviewed. No pertinent surgical history. Family History: History reviewed. No pertinent family history. Family Psychiatric  History: See previous.  Positive for bipolar disorder Social History:  Social History   Substance and Sexual Activity  Alcohol Use Not Currently   Comment: UTA     Social History   Substance and Sexual Activity  Drug Use Yes   Comment: Overdosed on prescription pills 3/7    Social History   Socioeconomic History  . Marital status: Divorced    Spouse name: Not on file  . Number of children: Not on file  . Years of education: Not on file  . Highest education level: Not on file  Occupational History  . Not on file  Tobacco Use  . Smoking status: Current Every Day Smoker    Packs/day: 1.00    Types: Cigarettes  . Smokeless tobacco: Never Used  Substance and Sexual Activity  . Alcohol use: Not Currently    Comment: UTA  . Drug use: Yes    Comment: Overdosed on prescription pills 3/7  . Sexual activity: Not on file  Other Topics Concern  . Not on file  Social History Narrative  . Not on file   Social Determinants of Health   Financial Resource Strain:   . Difficulty of Paying Living Expenses:   Food Insecurity:   . Worried About Paediatric nurse in the Last Year:   . Arboriculturist in the Last Year:   Transportation Needs:   . Film/video editor (Medical):   Marland Kitchen Lack of Transportation (Non-Medical):   Physical Activity:   . Days of Exercise per Week:   . Minutes of Exercise per Session:   Stress:   . Feeling of Stress :   Social Connections:   . Frequency of Communication with Friends and Family:   . Frequency of Social Gatherings with Friends and Family:   . Attends Religious Services:   . Active Member of Clubs or Organizations:   . Attends Archivist Meetings:   Marland Kitchen Marital Status:    Additional Social History:                         Sleep: Fair  Appetite:  Fair  Current Medications: Current Facility-Administered Medications  Medication Dose Route Frequency Provider Last Rate Last Admin  . acetaminophen (TYLENOL) tablet 650 mg  650 mg Oral Q6H PRN Sherlock Nancarrow, Madie Reno, MD   650 mg at 10/12/19 1532  . alum & mag hydroxide-simeth (MAALOX/MYLANTA) 200-200-20 MG/5ML suspension 30 mL  30 mL Oral Q4H PRN Aizlynn Digilio T, MD      . influenza vac split quadrivalent PF (FLUARIX) injection 0.5 mL  0.5 mL Intramuscular Tomorrow-1000 Gennette Shadix, Madie Reno, MD      . magnesium  hydroxide (MILK OF MAGNESIA) suspension 30 mL  30 mL Oral Daily PRN Manuel Lawhead T, MD      . pneumococcal 23 valent vaccine (PNEUMOVAX-23) injection 0.5 mL  0.5 mL Intramuscular Tomorrow-1000 Jamaya Sleeth T, MD      . QUEtiapine (SEROQUEL) tablet 200 mg  200 mg Oral QHS Quintyn Dombek T, MD        Lab Results: No results found for this or any previous visit (from the past 48 hour(s)).  Blood Alcohol level:  Lab Results  Component Value Date   ETH <10 10/08/2019    Metabolic Disorder Labs: No results found for: HGBA1C, MPG No results found for: PROLACTIN No results found for: CHOL, TRIG, HDL, CHOLHDL, VLDL, LDLCALC  Physical Findings: AIMS: Facial and Oral Movements Muscles of Facial Expression: None, normal Lips and  Perioral Area: None, normal Jaw: None, normal Tongue: None, normal,Extremity Movements Upper (arms, wrists, hands, fingers): None, normal Lower (legs, knees, ankles, toes): None, normal, Trunk Movements Neck, shoulders, hips: None, normal, Overall Severity Severity of abnormal movements (highest score from questions above): None, normal Incapacitation due to abnormal movements: None, normal Patient's awareness of abnormal movements (rate only patient's report): No Awareness, Dental Status Current problems with teeth and/or dentures?: No Does patient usually wear dentures?: No  CIWA:    COWS:     Musculoskeletal: Strength & Muscle Tone: within normal limits Gait & Station: normal Patient leans: N/A  Psychiatric Specialty Exam: Physical Exam  Nursing note and vitals reviewed. Constitutional: He appears well-developed and well-nourished.  HENT:  Head: Normocephalic and atraumatic.  Eyes: Pupils are equal, round, and reactive to light. Conjunctivae are normal.  Cardiovascular: Regular rhythm and normal heart sounds.  Respiratory: Effort normal. No respiratory distress.  GI: Soft.  Musculoskeletal:        General: Normal range of motion.     Cervical back: Normal range of motion.  Neurological: He is alert.  Skin: Skin is warm and dry.  Psychiatric: Judgment normal. His affect is blunt. His speech is delayed. He is slowed. He exhibits a depressed mood. He expresses no suicidal ideation. He exhibits abnormal recent memory.    Review of Systems  Constitutional: Negative.   HENT: Negative.   Eyes: Negative.   Respiratory: Negative.   Cardiovascular: Negative.   Gastrointestinal: Negative.   Musculoskeletal: Negative.   Skin: Negative.   Neurological: Negative.   Psychiatric/Behavioral: Positive for decreased concentration, dysphoric mood and suicidal ideas.    Blood pressure 113/81, pulse 67, temperature 97.8 F (36.6 C), temperature source Oral, resp. rate 17, height 5\' 10"   (1.778 m), weight 68.5 kg, SpO2 98 %.Body mass index is 21.67 kg/m.  General Appearance: Casual  Eye Contact:  Fair  Speech:  Clear and Coherent  Volume:  Normal  Mood:  Depressed and Dysphoric  Affect:  Depressed  Thought Process:  Coherent  Orientation:  Full (Time, Place, and Person)  Thought Content:  Logical  Suicidal Thoughts:  Yes.  without intent/plan  Homicidal Thoughts:  No  Memory:  Immediate;   Fair Recent;   Poor Remote;   Fair  Judgement:  Fair  Insight:  Fair  Psychomotor Activity:  Decreased  Concentration:  Concentration: Fair  Recall:  of Knowledge:  Fair  Language:  Fair  Akathisia:  No  Handed:  Right  AIMS (if indicated):     Assets:  Desire for Improvement Housing Physical Health Resilience  ADL's:  Impaired  Cognition:  Impaired,  Mild  Sleep:  Number of Hours: 7.75     Treatment Plan Summary: Daily contact with patient to assess and evaluate symptoms and progress in treatment, Medication management and Plan Increased dose of Seroquel to 200 mg at night.  Encourage group attendance.  Cognitive therapy and psychoeducation regarding depression and bipolar disorder.  Mordecai Rasmussen, MD 10/12/2019, 4:14 PM

## 2019-10-12 NOTE — Progress Notes (Signed)
Patient alert and oriented x 4, affect is flat but he brightens upon approach he denies SI/HI/AVH, he is interacting appropriately with peers and staff, complaint with medication regimen, no distress noted, patient was offered emotional support, 15 minutes safety checks maintained will continue to monitor patient attended evening wrap up group.

## 2019-10-12 NOTE — [facility] Group Notes (Signed)
LCSW Group Therapy Note  10/12/2019 1:00 PM  Type of Therapy/Topic:  Group Therapy:  Balance in Life  Participation Level:  None  Description of Group:    This group will address the concept of balance and how it feels and looks when one is unbalanced. Patients will be encouraged to process areas in their lives that are out of balance and identify reasons for remaining unbalanced. Facilitators will guide patients in utilizing problem-solving interventions to address and correct the stressor making their life unbalanced. Understanding and applying boundaries will be explored and addressed for obtaining and maintaining a balanced life. Patients will be encouraged to explore ways to assertively make their unbalanced needs known to significant others in their lives, using other group members and facilitator for support and feedback.  Therapeutic Goals: 1. Patient will identify two or more emotions or situations they have that consume much of in their lives. 2. Patient will identify signs/triggers that life has become out of balance:  3. Patient will identify two ways to set boundaries in order to achieve balance in their lives:  4. Patient will demonstrate ability to communicate their needs through discussion and/or role plays  Summary of Patient Progress: Patient was present in group, however, did not engage in group discussions.   Therapeutic Modalities:   Cognitive Behavioral Therapy Solution-Focused Therapy Assertiveness Training  Penni Homans MSW, LCSW 10/12/2019 12:36 PM

## 2019-10-12 NOTE — [facility] Group Notes (Signed)
[facility] Group Notes:  (Nursing/MHT/Case Management/Adjunct)  Date:  10/12/2019  Time:  3:02 AM  Type of Therapy:  Group Therapy  Participation Level:  Active  Participation Quality:  Appropriate  Affect:  Appropriate  Cognitive:  Appropriate  Insight:  Appropriate and Good  Engagement in Group:  Developing/Improving, Engaged and Supportive  Modes of Intervention:  Discussion and Education  Summary of Progress/Problems: PT is wanting a safe release with sister. He wants to work again and stay away from all negativity  Landry Mellow 10/12/2019, 3:02 AM

## 2019-10-12 NOTE — [facility] Group Notes (Incomplete)
[facility] Group Notes:  (Nursing/MHT/Case Management/Adjunct) ? ?Date:  10/12/2019  ?Time:  2:43 AM ? ?Type of Therapy:  Group Therapy ? ?Participation Level:  Active ? ?Participation Quality:  Appropriate ? ?Affect:  Appropriate ? ?Cognitive:  Appropriate ? ?Insight:  Appropriate and Good ? ?Engagement in Group:  Developing/Improving, Engaged and Supportive ? ?Modes of Intervention:  Discussion and Education ? ?Summary of Progress/Problems: ? ?Tom Smith ?10/12/2019, 2:43 AM ?

## 2019-10-13 NOTE — [facility] Group Notes (Signed)
Feelings Around Relapse 10/13/2019 1PM  Type of Therapy and Topic:  Group Therapy:  Feelings around Relapse and Recovery  Participation Level:  Did Not Attend   Description of Group:    Patients in this group will discuss emotions they experience before and after a relapse. They will process how experiencing these feelings, or avoidance of experiencing them, relates to having a relapse. Facilitator will guide patients to explore emotions they have related to recovery. Patients will be encouraged to process which emotions are more powerful. They will be guided to discuss the emotional reaction significant others in their lives may have to patients' relapse or recovery. Patients will be assisted in exploring ways to respond to the emotions of others without this contributing to a relapse.  Therapeutic Goals: 1. Patient will identify two or more emotions that lead to a relapse for them 2. Patient will identify two emotions that result when they relapse 3. Patient will identify two emotions related to recovery 4. Patient will demonstrate ability to communicate their needs through discussion and/or role plays   Summary of Patient Progress:     Therapeutic Modalities:   Cognitive Behavioral Therapy Solution-Focused Therapy Assertiveness Training Relapse Prevention Therapy   Suzan Slick, LCSW 10/13/2019 2:14 PM

## 2019-10-13 NOTE — Progress Notes (Signed)
Recreation Therapy Notes  Date: 10/13/2019  Time: 9:30 am   Location: Craft room   Behavioral response: N/A   Intervention Topic: Relaxation    Discussion/Intervention: Patient did not attend group.   Clinical Observations/Feedback:  Patient did not attend group.   Efrata Brunner LRT/CTRS        Ruthie Berch 10/13/2019 11:34 AM

## 2019-10-13 NOTE — Plan of Care (Signed)
D- Patient alert and oriented. Patient presents in a depressed, but pleasant mood on assessment stating that he slept ok, "other than my mouth". Patient didn't complain of any mouth pain to this writer at this time. Patient endorsed both depression/anxiety stating that "the environment" and "I gotta find ways to deal with the things/situations", is why he's feeling this way. Patient denies SI, HI, AVH, and pain at this time. Patient's goal for today is to "get through the day".  A- Support and encouragement provided.  Routine safety checks conducted every 15 minutes.  Patient informed to notify staff with problems or concerns.  R- Patient receptive, calm, and cooperative. Patient interacts well with others on the unit.  Patient remains safe at this time.  Problem: Education: Goal: Knowledge of [facility] Education information/materials will improve Outcome: Progressing Goal: Emotional status will improve Outcome: Progressing Goal: Mental status will improve Outcome: Progressing Goal: Verbalization of understanding the information provided will improve Outcome: Progressing   Problem: Activity: Goal: Interest or engagement in activities will improve Outcome: Progressing Goal: Sleeping patterns will improve Outcome: Progressing   Problem: Coping: Goal: Ability to verbalize frustrations and anger appropriately will improve Outcome: Progressing Goal: Ability to demonstrate self-control will improve Outcome: Progressing   Problem: Health Behavior/Discharge Planning: Goal: Identification of resources available to assist in meeting health care needs will improve Outcome: Progressing Goal: Compliance with treatment plan for underlying cause of condition will improve Outcome: Progressing   Problem: Physical Regulation: Goal: Ability to maintain clinical measurements within normal limits will improve Outcome: Progressing   Problem: Safety: Goal: Periods of time without injury  will increase Outcome: Progressing   Problem: Education: Goal: Utilization of techniques to improve thought processes will improve Outcome: Progressing Goal: Knowledge of the prescribed therapeutic regimen will improve Outcome: Progressing   Problem: Activity: Goal: Interest or engagement in leisure activities will improve Outcome: Progressing Goal: Imbalance in normal sleep/wake cycle will improve Outcome: Progressing   Problem: Coping: Goal: Coping ability will improve Outcome: Progressing Goal: Will verbalize feelings Outcome: Progressing   Problem: Health Behavior/Discharge Planning: Goal: Ability to make decisions will improve Outcome: Progressing Goal: Compliance with therapeutic regimen will improve Outcome: Progressing   Problem: Role Relationship: Goal: Will demonstrate positive changes in social behaviors and relationships Outcome: Progressing   Problem: Safety: Goal: Ability to disclose and discuss suicidal ideas will improve Outcome: Progressing Goal: Ability to identify and utilize support systems that promote safety will improve Outcome: Progressing   Problem: Self-Concept: Goal: Will verbalize positive feelings about self Outcome: Progressing Goal: Level of anxiety will decrease Outcome: Progressing

## 2019-10-13 NOTE — Progress Notes (Signed)
[facility] MD Progress Note  10/13/2019 11:02 AM Rhae Lerner.  MRN:  [id number]   Subjective: Follow-up for this 54 year old male diagnosed with bipolar depression.  Patient reports that he is feeling somewhat better today.  He states that he slept through the night except for having to get up 1 time to take some Tylenol.  He reports that he still has a lot of anxiety about the things that he is dealing with at home.  He reports having issues dealing with a car that is broken down and he does not have enough Corydon Schweiss to fix it and also having dental issues and he does not have enough Kaylan Yates to fix that either and he does not have insurance.  He states that he does feel like things are improving for himself though.  He reports that his depression and anxiety are about a 5 out of 10 with 10 being the worst.  He reports that he noted that things were coming on and so that is why he decided to school and stay with his mom.  He states that he has had more episodes in the past and he started realizing what symptoms seem to be worsening his mood.  He denies any suicidal or homicidal ideations and denies any hallucinations.  He stated that he is not having any medication side effects with the increased dose.  He did agree to follow-up at [facility] and also discussed this with the social worker that it stopped by.  Principal Problem: Bipolar depression ([facility]) Diagnosis: Principal Problem:   Bipolar depression ([facility]) Active Problems:   Benzodiazepine overdose  Total Time spent with patient: 20 minutes  Past Psychiatric History: Patient is somewhat evasive about this.  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  He describes what probably sounds like a psychotic manic episode then.  Evidently after leaving the service he claims he never had any follow-up psychiatric treatment.  He says he never realized he had bipolar disorder until the manic episode he had this summer.  He does have a  past history of suicide attempts including a recent history of suicide attempt.  Evidently does not know of any previous medicines he has been on.  Used to have a drinking problem but claims to have stopped months ago and not be abusing any drugs  Past Medical History:  Past Medical History:  Diagnosis Date  . Psoriatic arthritis ([facility])    History reviewed. No pertinent surgical history. Family History: History reviewed. No pertinent family history. Family Psychiatric  History: Sister is reported to have bipolar disorder Social History:  Social History   Substance and Sexual Activity  Alcohol Use Not Currently   Comment: UTA     Social History   Substance and Sexual Activity  Drug Use Yes   Comment: Overdosed on prescription pills 3/7    Social History   Socioeconomic History  . Marital status: Divorced    Spouse name: Not on file  . Number of children: Not on file  . Years of education: Not on file  . Highest education level: Not on file  Occupational History  . Not on file  Tobacco Use  . Smoking status: Current Every Day Smoker    Packs/day: 1.00    Types: Cigarettes  . Smokeless tobacco: Never Used  Substance and Sexual Activity  . Alcohol use: Not Currently    Comment: UTA  . Drug use: Yes    Comment: Overdosed  on prescription pills 3/7  . Sexual activity: Not on file  Other Topics Concern  . Not on file  Social History Narrative  . Not on file   Social Determinants of Health   Financial Resource Strain:   . Difficulty of Paying Living Expenses:   Food Insecurity:   . Worried About Programme researcher, broadcasting/film/video in the Last Year:   . Barista in the Last Year:   Transportation Needs:   . Freight forwarder (Medical):   Marland Kitchen Lack of Transportation (Non-Medical):   Physical Activity:   . Days of Exercise per Week:   . Minutes of Exercise per Session:   Stress:   . Feeling of Stress :   Social Connections:   . Frequency of Communication with Friends and  Family:   . Frequency of Social Gatherings with Friends and Family:   . Attends Religious Services:   . Active Member of Clubs or Organizations:   . Attends Banker Meetings:   Marland Kitchen Marital Status:    Additional Social History:                         Sleep: Good  Appetite:  Good  Current Medications: Current Facility-Administered Medications  Medication Dose Route Frequency Provider Last Rate Last Admin  . acetaminophen (TYLENOL) tablet 650 mg  650 mg Oral Q6H PRN Clapacs, Jackquline Denmark, MD   650 mg at 10/13/19 0100  . alum & mag hydroxide-simeth (MAALOX/MYLANTA) 200-200-20 MG/5ML suspension 30 mL  30 mL Oral Q4H PRN Clapacs, John T, MD      . influenza vac split quadrivalent PF (FLUARIX) injection 0.5 mL  0.5 mL Intramuscular Tomorrow-1000 Clapacs, John T, MD      . magnesium hydroxide (MILK OF MAGNESIA) suspension 30 mL  30 mL Oral Daily PRN Clapacs, John T, MD      . pneumococcal 23 valent vaccine (PNEUMOVAX-23) injection 0.5 mL  0.5 mL Intramuscular Tomorrow-1000 Clapacs, John T, MD      . QUEtiapine (SEROQUEL) tablet 200 mg  200 mg Oral QHS Clapacs, John T, MD   200 mg at 10/12/19 2104    Lab Results: No results found for this or any previous visit (from the past 48 hour(s)).  Blood Alcohol level:  Lab Results  Component Value Date   ETH <10 10/08/2019    Metabolic Disorder Labs: No results found for: HGBA1C, MPG No results found for: PROLACTIN No results found for: CHOL, TRIG, HDL, CHOLHDL, VLDL, LDLCALC  Physical Findings: AIMS: Facial and Oral Movements Muscles of Facial Expression: None, normal Lips and Perioral Area: None, normal Jaw: None, normal Tongue: None, normal,Extremity Movements Upper (arms, wrists, hands, fingers): None, normal Lower (legs, knees, ankles, toes): None, normal, Trunk Movements Neck, shoulders, hips: None, normal, Overall Severity Severity of abnormal movements (highest score from questions above): None,  normal Incapacitation due to abnormal movements: None, normal Patient's awareness of abnormal movements (rate only patient's report): No Awareness, Dental Status Current problems with teeth and/or dentures?: No Does patient usually wear dentures?: No  CIWA:    COWS:     Musculoskeletal: Strength & Muscle Tone: within normal limits Gait & Station: normal Patient leans: N/A  Psychiatric Specialty Exam: Physical Exam  Nursing note and vitals reviewed. Constitutional: He is oriented to person, place, and time. He appears well-developed and well-nourished.  Cardiovascular: Normal rate.  Respiratory: Effort normal.  Musculoskeletal:        General:  Normal range of motion.  Neurological: He is alert and oriented to person, place, and time.  Skin: Skin is warm.    Review of Systems  Constitutional: Negative.   HENT: Negative.   Eyes: Negative.   Respiratory: Negative.   Cardiovascular: Negative.   Gastrointestinal: Negative.   Genitourinary: Negative.   Musculoskeletal: Negative.   Skin: Negative.   Neurological: Negative.   Psychiatric/Behavioral: Positive for dysphoric mood. The patient is nervous/anxious.     Blood pressure 119/72, pulse 74, temperature 98 F (36.7 C), temperature source Oral, resp. rate 17, height 5\' 10"  (1.778 m), weight 68.5 kg, SpO2 100 %.Body mass index is 21.67 kg/m.  General Appearance: Casual  Eye Contact:  Good  Speech:  Clear and Coherent and Normal Rate  Volume:  Normal  Mood:  Anxious and Depressed  Affect:  Congruent  Thought Process:  Coherent and Descriptions of Associations: Intact  Orientation:  Full (Time, Place, and Person)  Thought Content:  WDL  Suicidal Thoughts:  No  Homicidal Thoughts:  No  Memory:  Immediate;   Fair Recent;   Fair Remote;   Fair  Judgement:  Fair  Insight:  Fair  Psychomotor Activity:  Normal  Concentration:  Concentration: Fair  Recall:  [employer] of Knowledge:  Fair  Language:  Good  Akathisia:  No   Handed:  Right  AIMS (if indicated):     Assets:  Communication Skills Desire for Improvement Housing Resilience Social Support  ADL's:  Intact  Cognition:  WNL  Sleep:  Number of Hours: 8.15   Assessment: Patient presents in his room sitting on the bed and is pleasant, calm, cooperative.  Patient continued reporting that he is doing with some anxiety as well as depression.  He has continued to deny any suicidal or homicidal ideations.  He states that he is working on things for when he gets out of the hospital so that it will make things better for him.  He will continue his current medication regimen at this time as he is tolerating it well and it seems to be showing improvement.  Patient has been compliant with medication is also been compliant with treatment and attending groups.  Patient has been interacting with peers and staff appropriately.  Treatment Plan Summary: Daily contact with patient to assess and evaluate symptoms and progress in treatment and Medication management Continue Seroquel 200 mg p.o. nightly for bipolar depression Encourage group therapy participation Continue every 15 minute safety checks  Lewis Shock, FNP 10/13/2019, 11:02 AM

## 2019-10-13 NOTE — Progress Notes (Signed)
Patient alert and oriented x 4, thoughts are organized and coherent,  affect is flat but he brightens upon approach he denies SI/HI/AVH, he is interacting appropriately with peers and staff, complaint with medication regimen, no distress noted, patient was offered emotional support, 15 minutes safety checks maintained will continue to monitor patient attended evening wrap up group.
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
## 2019-10-13 NOTE — [facility] Counselor (Signed)
CSW met with patient to discuss aftercare.   Patient reports that he is open to a referral.   CSW obtained signatures.  CSW and pt discussed RHA.  Patient was agreeable.   CSW will refer to Lanae Boast or make hospital discharge appointment.   Assunta Curtis, MSW, LCSW 10/13/2019 10:28 AM

## 2019-10-14 MED ORDER — QUETIAPINE FUMARATE 200 MG PO TABS
300.0000 mg | ORAL_TABLET | Freq: Every day | ORAL | Status: DC
  Administered 2019-10-14 – 2019-10-15 (×2): 300 mg via ORAL
  Filled 2019-10-14 (×2): qty 1

## 2019-10-14 NOTE — [facility] Group Notes (Signed)
LCSW Group Therapy Notes  Date and Time: 10/14/2019 1:00PM  Type of Therapy and Topic: Group Therapy: Healthy Vs. Unhealthy Coping Strategies  Participation Level: [facility] PARTICIPATION LEVEL: Did Not Attend  Description of Group:  In this group, patients will be encouraged to explore their healthy and unhealthy coping strategics. Coping strategies are actions that we take to deal with stress, problems, or uncomfortable emotions in our daily lives. Each patient will be challenged to read some scenarios and discuss the unhealthy and healthy coping strategies within those scenarios. Also, each patient will be challenged to describe current healthy and unhealthy strategies that they use in their own lives and discuss the outcomes and barriers to those strategies. This group will be process-oriented, with patients participating in exploration of their own experiences as well as giving and receiving support and challenge from other group members.  Therapeutic Goals: 1. Patient will identify personal healthy and unhealthy coping strategies. 2. Patient will identify healthy and unhealthy coping strategies, in others, through scenarios.  3. Patient will identify expected outcomes of healthy and unhealthy coping strategies. 4. Patient will identify barriers to using healthy coping strategies.   Summary of Patient Progress:  X   Therapeutic Modalities:  Cognitive Behavioral Therapy Solution Focused Therapy Motivational Interviewing   Mont Yafet Cline, MSW, LCSWA Clinical Social Worker    

## 2019-10-14 NOTE — Progress Notes (Signed)
[facility] MD Progress Note  10/14/2019 3:30 PM Rhae Lerner.  MRN:  [id number] Subjective: Follow-up for this gentleman with what appears to be bipolar depression.  Patient reports that he still feels depressed but has no suicidal thoughts.  Mostly now he feels bored at being in the hospital.  He does not present as psychotic or agitated.  He is able to articulate a more positive plan for the future.  Sleeping better. Principal Problem: Bipolar depression (HCC) Diagnosis: Principal Problem:   Bipolar depression (HCC) Active Problems:   Benzodiazepine overdose  Total Time spent with patient: 30 minutes  Past Psychiatric History: Past history of depression and mania of the summer  Past Medical History:  Past Medical History:  Diagnosis Date  . Psoriatic arthritis (HCC)    History reviewed. No pertinent surgical history. Family History: History reviewed. No pertinent family history. Family Psychiatric  History: See previous.  Positive for bipolar Social History:  Social History   Substance and Sexual Activity  Alcohol Use Not Currently   Comment: UTA     Social History   Substance and Sexual Activity  Drug Use Yes   Comment: Overdosed on prescription pills 3/7    Social History   Socioeconomic History  . Marital status: Divorced    Spouse name: Not on file  . Number of children: Not on file  . Years of education: Not on file  . Highest education level: Not on file  Occupational History  . Not on file  Tobacco Use  . Smoking status: Current Every Day Smoker    Packs/day: 1.00    Types: Cigarettes  . Smokeless tobacco: Never Used  Substance and Sexual Activity  . Alcohol use: Not Currently    Comment: UTA  . Drug use: Yes    Comment: Overdosed on prescription pills 3/7  . Sexual activity: Not on file  Other Topics Concern  . Not on file  Social History Narrative  . Not on file   Social Determinants of Health   Financial Resource Strain:   . Difficulty of Paying  Living Expenses:   Food Insecurity:   . Worried About Programme researcher, broadcasting/film/video in the Last Year:   . Barista in the Last Year:   Transportation Needs:   . Freight forwarder (Medical):   Marland Kitchen Lack of Transportation (Non-Medical):   Physical Activity:   . Days of Exercise per Week:   . Minutes of Exercise per Session:   Stress:   . Feeling of Stress :   Social Connections:   . Frequency of Communication with Friends and Family:   . Frequency of Social Gatherings with Friends and Family:   . Attends Religious Services:   . Active Member of Clubs or Organizations:   . Attends Banker Meetings:   Marland Kitchen Marital Status:    Additional Social History:                         Sleep: Fair  Appetite:  Fair  Current Medications: Current Facility-Administered Medications  Medication Dose Route Frequency Provider Last Rate Last Admin  . acetaminophen (TYLENOL) tablet 650 mg  650 mg Oral Q6H PRN Layloni Fahrner, Jackquline Denmark, MD   650 mg at 10/13/19 0100  . alum & mag hydroxide-simeth (MAALOX/MYLANTA) 200-200-20 MG/5ML suspension 30 mL  30 mL Oral Q4H PRN Leronda Lewers T, MD      . influenza vac split quadrivalent PF (FLUARIX) injection 0.5  mL  0.5 mL Intramuscular Tomorrow-1000 Fernanda Twaddell T, MD      . magnesium hydroxide (MILK OF MAGNESIA) suspension 30 mL  30 mL Oral Daily PRN Lestine Rahe T, MD      . pneumococcal 23 valent vaccine (PNEUMOVAX-23) injection 0.5 mL  0.5 mL Intramuscular Tomorrow-1000 Renalda Locklin T, MD      . QUEtiapine (SEROQUEL) tablet 200 mg  200 mg Oral QHS Katye Valek T, MD   200 mg at 10/13/19 2057    Lab Results: No results found for this or any previous visit (from the past 48 hour(s)).  Blood Alcohol level:  Lab Results  Component Value Date   ETH <10 97/09/6376    Metabolic Disorder Labs: No results found for: HGBA1C, MPG No results found for: PROLACTIN No results found for: CHOL, TRIG, HDL, CHOLHDL, VLDL, LDLCALC  Physical  Findings: AIMS: Facial and Oral Movements Muscles of Facial Expression: None, normal Lips and Perioral Area: None, normal Jaw: None, normal Tongue: None, normal,Extremity Movements Upper (arms, wrists, hands, fingers): None, normal Lower (legs, knees, ankles, toes): None, normal, Trunk Movements Neck, shoulders, hips: None, normal, Overall Severity Severity of abnormal movements (highest score from questions above): None, normal Incapacitation due to abnormal movements: None, normal Patient's awareness of abnormal movements (rate only patient's report): No Awareness, Dental Status Current problems with teeth and/or dentures?: No Does patient usually wear dentures?: No  CIWA:    COWS:     Musculoskeletal: Strength & Muscle Tone: within normal limits Gait & Station: normal Patient leans: N/A  Psychiatric Specialty Exam: Physical Exam  Nursing note and vitals reviewed. Constitutional: He appears well-developed and well-nourished.  HENT:  Head: Normocephalic and atraumatic.  Eyes: Pupils are equal, round, and reactive to light. Conjunctivae are normal.  Cardiovascular: Regular rhythm and normal heart sounds.  Respiratory: Effort normal. No respiratory distress.  GI: Soft.  Musculoskeletal:        General: Normal range of motion.     Cervical back: Normal range of motion.  Neurological: He is alert.  Skin: Skin is warm and dry.  Psychiatric: His speech is normal and behavior is normal. Judgment and thought content normal. His mood appears anxious. Cognition and memory are normal.    Review of Systems  Constitutional: Negative.   HENT: Negative.   Eyes: Negative.   Respiratory: Negative.   Cardiovascular: Negative.   Gastrointestinal: Negative.   Musculoskeletal: Negative.   Skin: Negative.   Neurological: Negative.   Psychiatric/Behavioral: Positive for dysphoric mood. Negative for suicidal ideas.    Blood pressure 105/84, pulse 98, temperature 97.9 F (36.6 C),  temperature source Oral, resp. rate 18, height 5\' 10"  (1.778 m), weight 68.5 kg, SpO2 100 %.Body mass index is 21.67 kg/m.  General Appearance: Casual  Eye Contact:  Good  Speech:  Clear and Coherent  Volume:  Decreased  Mood:  Depressed  Affect:  Congruent  Thought Process:  Goal Directed  Orientation:  Full (Time, Place, and Person)  Thought Content:  Logical  Suicidal Thoughts:  No  Homicidal Thoughts:  No  Memory:  Immediate;   Fair Recent;   Fair Remote;   Fair  Judgement:  Fair  Insight:  Fair  Psychomotor Activity:  Decreased  Concentration:  Concentration: Fair  Recall:  [employer] of Knowledge:  Fair  Language:  Fair  Akathisia:  No  Handed:  Right  AIMS (if indicated):     Assets:  [place]ire for [place]  ADL's:  Intact  Cognition:  WNL  Sleep:  Number of Hours: 7.45     Treatment Plan Summary: Daily contact with patient to assess and evaluate symptoms and progress in treatment, Medication management and Plan Try increasing the Seroquel one more time but otherwise we will probably be aiming for discharge on Monday.  Reviewed with him the importance of staying on medication and having follow-up treatment and he agrees to this.  Mordecai Rasmussen, MD 10/14/2019, 3:30 PM

## 2019-10-14 NOTE — Plan of Care (Signed)
  Problem: Education: Goal: Emotional status will improve Outcome: Progressing Goal: Mental status will improve Outcome: Progressing Goal: Verbalization of understanding the information provided will improve Outcome: Progressing   Problem: Activity: Goal: Interest or engagement in activities will improve Outcome: Not Progressing   

## 2019-10-14 NOTE — Tx Team (Signed)
Interdisciplinary Treatment and Diagnostic Plan Update  10/14/2019 Time of Session: 200PM Ilda Basset. MRN: [id number]  Principal Diagnosis: Bipolar depression ([place])  Secondary Diagnoses: Principal Problem:   Bipolar depression ([place]) Active Problems:   Benzodiazepine overdose   Current Medications:  Current Facility-Administered Medications  Medication Dose Route Frequency Provider Last Rate Last Admin  . acetaminophen (TYLENOL) tablet 650 mg  650 mg Oral Q6H PRN Clapacs, Madie Reno, MD   650 mg at 10/13/19 0100  . alum & mag hydroxide-simeth (MAALOX/MYLANTA) 200-200-20 MG/5ML suspension 30 mL  30 mL Oral Q4H PRN Clapacs, John T, MD      . influenza vac split quadrivalent PF (FLUARIX) injection 0.5 mL  0.5 mL Intramuscular Tomorrow-1000 Clapacs, John T, MD      . magnesium hydroxide (MILK OF MAGNESIA) suspension 30 mL  30 mL Oral Daily PRN Clapacs, John T, MD      . pneumococcal 23 valent vaccine (PNEUMOVAX-23) injection 0.5 mL  0.5 mL Intramuscular Tomorrow-1000 Clapacs, John T, MD      . QUEtiapine (SEROQUEL) tablet 200 mg  200 mg Oral QHS Clapacs, John T, MD   200 mg at 10/13/19 2057   PTA Medications: No medications prior to admission.    Patient Stressors: Substance abuse  Patient Strengths: Average or above average intelligence Capable of independent living Communication skills Physical Health  Treatment Modalities: Medication Management, Group therapy, Case management,  1 to 1 session with clinician, Psychoeducation, Recreational therapy.   Physician Treatment Plan for Primary Diagnosis: Bipolar depression ([place]) Long Term Goal(s): Improvement in symptoms so as ready for discharge Improvement in symptoms so as ready for discharge   Short Term Goals: Ability to disclose and discuss suicidal ideas Ability to demonstrate self-control will improve Ability to maintain clinical measurements within normal limits will improve Compliance with prescribed medications will  improve  Medication Management: Evaluate patient's response, side effects, and tolerance of medication regimen.  Therapeutic Interventions: 1 to 1 sessions, Unit Group sessions and Medication administration.  Evaluation of Outcomes: Progressing  Physician Treatment Plan for Secondary Diagnosis: Principal Problem:   Bipolar depression ([place]) Active Problems:   Benzodiazepine overdose  Long Term Goal(s): Improvement in symptoms so as ready for discharge Improvement in symptoms so as ready for discharge   Short Term Goals: Ability to disclose and discuss suicidal ideas Ability to demonstrate self-control will improve Ability to maintain clinical measurements within normal limits will improve Compliance with prescribed medications will improve     Medication Management: Evaluate patient's response, side effects, and tolerance of medication regimen.  Therapeutic Interventions: 1 to 1 sessions, Unit Group sessions and Medication administration.  Evaluation of Outcomes: Progressing   RN Treatment Plan for Primary Diagnosis: Bipolar depression ([place]) Long Term Goal(s): Knowledge of disease and therapeutic regimen to maintain health will improve  Short Term Goals: Ability to remain free from injury will improve, Ability to demonstrate self-control, Ability to disclose and discuss suicidal ideas, Ability to identify and develop effective coping behaviors will improve and Compliance with prescribed medications will improve  Medication Management: RN will administer medications as ordered by provider, will assess and evaluate patient's response and provide education to patient for prescribed medication. RN will report any adverse and/or side effects to prescribing provider.  Therapeutic Interventions: 1 on 1 counseling sessions, Psychoeducation, Medication administration, Evaluate responses to treatment, Monitor vital signs and CBGs as ordered, Perform/monitor CIWA, COWS, AIMS and Fall Risk  screenings as ordered, Perform wound care treatments as ordered.  Evaluation of Outcomes:  Progressing   LCSW Treatment Plan for Primary Diagnosis: Bipolar depression (HCC) Long Term Goal(s): Safe transition to appropriate next level of care at discharge, Engage patient in therapeutic group addressing interpersonal concerns.  Short Term Goals: Engage patient in aftercare planning with referrals and resources, Increase social support, Facilitate acceptance of mental health diagnosis and concerns and Increase skills for wellness and recovery  Therapeutic Interventions: Assess for all discharge needs, 1 to 1 time with Social worker, Explore available resources and support systems, Assess for adequacy in community support network, Educate family and significant other(s) on suicide prevention, Complete Psychosocial Assessment, Interpersonal group therapy.  Evaluation of Outcomes: Progressing   Progress in Treatment: Attending groups: Yes. Participating in groups: Yes. Taking medication as prescribed: Yes. Toleration medication: Yes. Family/Significant other contact made: No, will contact:  pts sister Patient understands diagnosis: Yes. Discussing patient identified problems/goals with staff: Yes. Medical problems stabilized or resolved: Yes. Denies suicidal/homicidal ideation: Yes. Issues/concerns per patient self-inventory: No. Other: N/A  New problem(s) identified: No, Describe:  none  New Short Term/Long Term Goal(s): Detox, elimination of AVH/symptoms of psychosis, medication management for mood stabilization; elimination of SI thoughts; development of comprehensive mental wellness/sobriety plan.   Patient Goals:  "To not be lost and anxiety"  Discharge Plan or Barriers: SPE pamphlet, Mobile Crisis information, and AA/NA information provided to patient for additional community support and resources. Pt declines referrals for follow up.  Reason for Continuation of Hospitalization:  Anxiety Depression Medication stabilization Suicidal ideation  Estimated Length of Stay: 3-5 days  Recreational Therapy: Patient Stressors: N/A Patient Goal: Patient will engage in groups without prompting or encouragement from LRT x3 group sessions within 5 recreation therapy group sessions  Attendees: Patient:  10/14/2019 2:53 PM  Physician: Dr Toni Amend MD 10/14/2019 2:53 PM  Nursing: Torrie Mayers, RN 10/14/2019 2:53 PM  RN Care Manager: 10/14/2019 2:53 PM  Social Worker: Teresita Madura, [place] 10/14/2019 2:53 PM  Recreational Therapist:  10/14/2019 2:53 PM  Other:  10/14/2019 2:53 PM  Other:  10/14/2019 2:53 PM  Other: 10/14/2019 2:53 PM    Scribe for Treatment Team: Jimmey Ralph, LCSWA 10/14/2019 2:53 PM

## 2019-10-14 NOTE — Progress Notes (Signed)
Patient alert and oriented x 4, affect is flat but he brightens upon approach he denies SI/HI/AVH, he is interacting appropriately with peers and staff, complaint with medication regimen, no distress noted, 15 minutes safety checks maintained will continue to monitor.

## 2019-10-14 NOTE — Progress Notes (Signed)
Pt pleasant and cooperative. Isolative to self and room. Pt states he is fine, this is just to long and he is over it. Pt denies any SI, HI, AVH. Encouragement and support offered. Safety checks maintained. Pt receptive and remains safe on unit with q 15 min checks.

## 2019-10-15 MED ORDER — QUETIAPINE FUMARATE 300 MG PO TABS: 300.0000 mg | ORAL_TABLET | Freq: Every day | ORAL | 1 refills | Status: DC

## 2019-10-15 MED ORDER — QUETIAPINE FUMARATE 300 MG PO TABS: 300.0000 mg | ORAL_TABLET | Freq: Every day | ORAL | 0 refills | Status: DC

## 2019-10-15 NOTE — Progress Notes (Signed)
[facility] MD Progress Note  10/15/2019 2:27 PM Rhae Lerner.  MRN:  [id number] Subjective: Feeling better.  A little tired today from the increased dose of Seroquel.  Mood is improved however no longer having suicidal thoughts. Principal Problem: Bipolar depression (HCC) Diagnosis: Principal Problem:   Bipolar depression (HCC) Active Problems:   Benzodiazepine overdose  Total Time spent with patient: 30 minutes  Past Psychiatric History: Past history of mania in the summer recent suicide attempt  Past Medical History:  Past Medical History:  Diagnosis Date  . Psoriatic arthritis (HCC)    History reviewed. No pertinent surgical history. Family History: History reviewed. No pertinent family history. Family Psychiatric  History: See previous.  Has a family history of bipolar Social History:  Social History   Substance and Sexual Activity  Alcohol Use Not Currently   Comment: UTA     Social History   Substance and Sexual Activity  Drug Use Yes   Comment: Overdosed on prescription pills 3/7    Social History   Socioeconomic History  . Marital status: Divorced    Spouse name: Not on file  . Number of children: Not on file  . Years of education: Not on file  . Highest education level: Not on file  Occupational History  . Not on file  Tobacco Use  . Smoking status: Current Every Day Smoker    Packs/day: 1.00    Types: Cigarettes  . Smokeless tobacco: Never Used  Substance and Sexual Activity  . Alcohol use: Not Currently    Comment: UTA  . Drug use: Yes    Comment: Overdosed on prescription pills 3/7  . Sexual activity: Not on file  Other Topics Concern  . Not on file  Social History Narrative  . Not on file   Social Determinants of Health   Financial Resource Strain:   . Difficulty of Paying Living Expenses:   Food Insecurity:   . Worried About Programme researcher, broadcasting/film/video in the Last Year:   . Barista in the Last Year:   Transportation Needs:   . Automotive engineer (Medical):   Marland Kitchen Lack of Transportation (Non-Medical):   Physical Activity:   . Days of Exercise per Week:   . Minutes of Exercise per Session:   Stress:   . Feeling of Stress :   Social Connections:   . Frequency of Communication with Friends and Family:   . Frequency of Social Gatherings with Friends and Family:   . Attends Religious Services:   . Active Member of Clubs or Organizations:   . Attends Banker Meetings:   Marland Kitchen Marital Status:    Additional Social History:                         Sleep: Fair  Appetite:  Fair  Current Medications: Current Facility-Administered Medications  Medication Dose Route Frequency Provider Last Rate Last Admin  . acetaminophen (TYLENOL) tablet 650 mg  650 mg Oral Q6H PRN Laray Rivkin, Jackquline Denmark, MD   650 mg at 10/13/19 0100  . alum & mag hydroxide-simeth (MAALOX/MYLANTA) 200-200-20 MG/5ML suspension 30 mL  30 mL Oral Q4H PRN Ashya Nicolaisen T, MD      . influenza vac split quadrivalent PF (FLUARIX) injection 0.5 mL  0.5 mL Intramuscular Tomorrow-1000 Ambermarie Honeyman T, MD      . magnesium hydroxide (MILK OF MAGNESIA) suspension 30 mL  30 mL Oral Daily PRN Maayan Jenning T,  MD      . pneumococcal 23 valent vaccine (PNEUMOVAX-23) injection 0.5 mL  0.5 mL Intramuscular Tomorrow-1000 Latosha Gaylord T, MD      . QUEtiapine (SEROQUEL) tablet 300 mg  300 mg Oral QHS Zykira Matlack T, MD   300 mg at 10/14/19 2103    Lab Results: No results found for this or any previous visit (from the past 48 hour(s)).  Blood Alcohol level:  Lab Results  Component Value Date   ETH <10 09/32/3557    Metabolic Disorder Labs: No results found for: HGBA1C, MPG No results found for: PROLACTIN No results found for: CHOL, TRIG, HDL, CHOLHDL, VLDL, LDLCALC  Physical Findings: AIMS: Facial and Oral Movements Muscles of Facial Expression: None, normal Lips and Perioral Area: None, normal Jaw: None, normal Tongue: None, normal,Extremity  Movements Upper (arms, wrists, hands, fingers): None, normal Lower (legs, knees, ankles, toes): None, normal, Trunk Movements Neck, shoulders, hips: None, normal, Overall Severity Severity of abnormal movements (highest score from questions above): None, normal Incapacitation due to abnormal movements: None, normal Patient's awareness of abnormal movements (rate only patient's report): No Awareness, Dental Status Current problems with teeth and/or dentures?: No Does patient usually wear dentures?: No  CIWA:    COWS:     Musculoskeletal: Strength & Muscle Tone: within normal limits Gait & Station: normal Patient leans: N/A  Psychiatric Specialty Exam: Physical Exam  Nursing note and vitals reviewed. Constitutional: He appears well-developed and well-nourished.  HENT:  Head: Normocephalic and atraumatic.  Eyes: Pupils are equal, round, and reactive to light. Conjunctivae are normal.  Cardiovascular: Regular rhythm and normal heart sounds.  Respiratory: Effort normal. No respiratory distress.  GI: Soft.  Musculoskeletal:        General: Normal range of motion.     Cervical back: Normal range of motion.  Neurological: He is alert.  Skin: Skin is warm and dry.  Psychiatric: He has a normal mood and affect. His behavior is normal. Judgment and thought content normal.    Review of Systems  Constitutional: Negative.   HENT: Negative.   Eyes: Negative.   Respiratory: Negative.   Cardiovascular: Negative.   Gastrointestinal: Negative.   Musculoskeletal: Negative.   Skin: Negative.   Neurological: Negative.   Psychiatric/Behavioral: Negative.     Blood pressure 111/86, pulse (!) 102, temperature 98.1 F (36.7 C), temperature source Oral, resp. rate 18, height 5\' 10"  (1.778 m), weight 68.5 kg, SpO2 99 %.Body mass index is 21.67 kg/m.  General Appearance: Casual  Eye Contact:  Good  Speech:  Clear and Coherent  Volume:  Normal  Mood:  Euthymic  Affect:  Constricted  Thought  Process:  Goal Directed  Orientation:  Full (Time, Place, and Person)  Thought Content:  Logical  Suicidal Thoughts:  No  Homicidal Thoughts:  No  Memory:  Immediate;   Fair Recent;   Fair Remote;   Fair  Judgement:  Fair  Insight:  Fair  Psychomotor Activity:  Normal  Concentration:  Concentration: Fair  Recall:  [employer] of Knowledge:  Fair  Language:  Fair  Akathisia:  No  Handed:  Right  AIMS (if indicated):     Assets:  Desire for Improvement  ADL's:  Intact  Cognition:  WNL  Sleep:  Number of Hours: 9     Treatment Plan Summary: Daily contact with patient to assess and evaluate symptoms and progress in treatment, Medication management and Plan Plan for discharge tomorrow with follow-up through [facility].  Reviewed plan  and medication with patient.  Will make preparations so that he can have a 7-day supply of possible as well.  Mordecai Rasmussen, MD 10/15/2019, 2:27 PM

## 2019-10-15 NOTE — Plan of Care (Signed)
  Problem: Education: Goal: Knowledge of [facility] Education information/materials will improve Outcome: Progressing Goal: Emotional status will improve Outcome: Progressing Goal: Mental status will improve Outcome: Progressing Goal: Verbalization of understanding the information provided will improve Outcome: Progressing   Problem: Activity: Goal: Interest or engagement in activities will improve Outcome: Progressing Goal: Sleeping patterns will improve Outcome: Progressing   

## 2019-10-15 NOTE — B[facility] Group Notes (Signed)
LCSW Group Therapy Note  10/15/2019 1:00pm  Type of Therapy and Topic:  Group Therapy:  Cognitive Distortions  Participation Level:  Did Not Attend   Description of Group:    Patients in this group will be introduced to the topic of cognitive distortions.  Patients will identify and describe cognitive distortions, describe the feelings these distortions create for them.  Patients will identify one or more situations in their personal life where they have cognitively distorted thinking and will verbalize challenging this cognitive distortion through positive thinking skills.  Patients will practice the skill of using positive affirmations to challenge cognitive distortions using affirmation cards.    Therapeutic Goals:  1. Patient will identify two or more cognitive distortions they have used 2. Patient will identify one or more emotions that stem from use of a cognitive distortion 3. Patient will demonstrate use of a positive affirmation to counter a cognitive distortion through discussion and/or role play. 4. Patient will describe one way cognitive distortions can be detrimental to wellness   Summary of Patient Progress:   X  Therapeutic Modalities:   Cognitive Behavioral Therapy Motivational Interviewing   Mont Shadee Montoya, MSW, LCSWA Clinical Social Worker  10/15/2019 2:56 PM 

## 2019-10-15 NOTE — Progress Notes (Signed)
Pt pleasant and cooperative. Isolative to self and room, the patient was compliant with medications. Pt denies any SI, HI, AVH. Encouragement and support offered. Safety checks maintained. Pt receptive and remains safe on unit with q 15 min checks.

## 2019-10-15 NOTE — [facility] Suicide Risk Assessment (Signed)
[facility] Discharge Suicide Risk Assessment   Principal Problem: Bipolar depression [facility]) Discharge Diagnoses: Principal Problem:   Bipolar depression (HCC) Active Problems:   Benzodiazepine overdose   Total Time spent with patient: 30 minutes  Musculoskeletal: Strength & Muscle Tone: within normal limits Gait & Station: normal Patient leans: N/A  Psychiatric Specialty Exam: Review of Systems  Constitutional: Negative.   HENT: Negative.   Eyes: Negative.   Respiratory: Negative.   Cardiovascular: Negative.   Gastrointestinal: Negative.   Musculoskeletal: Negative.   Skin: Negative.   Neurological: Negative.   Psychiatric/Behavioral: Negative.     Blood pressure 111/86, pulse (!) 102, temperature 98.1 F (36.7 C), temperature source Oral, resp. rate 18, height 5\' 10"  (1.778 m), weight 68.5 kg, SpO2 99 %.Body mass index is 21.67 kg/m.  General Appearance: Casual  Eye Contact::  Good  Speech:  Clear and Coherent409  Volume:  Normal  Mood:  Euthymic  Affect:  Congruent  Thought Process:  Goal Directed  Orientation:  Full (Time, Place, and Person)  Thought Content:  Logical  Suicidal Thoughts:  No  Homicidal Thoughts:  No  Memory:  Immediate;   Fair Recent;   Fair Remote;   Fair  Judgement:  Fair  Insight:  Fair  Psychomotor Activity:  Normal  Concentration:  Fair  Recall:  [phone] of Knowledge:Fair  Language: Fair  Akathisia:  No  Handed:  Right  AIMS (if indicated):     Assets:  Desire for Improvement  Sleep:  Number of Hours: 9  Cognition: WNL  ADL's:  Intact   Mental Status Per Nursing Assessment::   On Admission:  Suicidal ideation indicated by patient, Suicidal ideation indicated by others  Demographic Factors:  Male  Loss Factors: Financial problems/change in socioeconomic status  Historical Factors: Family history of mental illness or substance abuse  Risk Reduction Factors:   Positive social support  Continued Clinical Symptoms:  Bipolar  Disorder:   Mixed State  Cognitive Features That Contribute To Risk:  None    Suicide Risk:  Minimal: No identifiable suicidal ideation.  Patients presenting with no risk factors but with morbid ruminations; may be classified as minimal risk based on the severity of the depressive symptoms    Plan Of Care/Follow-up recommendations:  Activity:  Activity as tolerated Diet:  Regular diet Other:  Follow-up with [facility]  [phone], MD 10/15/2019, 2:29 PM

## 2019-10-15 NOTE — Progress Notes (Signed)
Pt continues to be isolative to self and room. Denies SI, HI, AVh. No medications ordered for this shift. No request made by patient for prns. Denies SI, HI, AVH. Did go outside with group. No other concerns or request  Voiced. Encouragement and support offered. Safety checks maintained. Pt receptive and remains safe on unit with q 15 min checks.

## 2019-10-16 NOTE — Progress Notes (Signed)
  [facility] Adult Case Management Discharge Plan :  Will you be returning to the same living situation after discharge:  Yes,  moms house At discharge, do you have transportation home?: Yes,  pts sister will pick him up Do you have the ability to pay for your medications: Yes,  mental health  Release of information consent forms completed and in the chart;   Patient to Follow up at: Follow-up Information    [facility] Follow up on 10/18/2019.   Why: You have an appointment scheduled for 10/18/19 at 12:30PM. This is in person, please wear a mask and bring photo id and discharge paperwork. Thank You! Contact information: [address] [place] [address] [phone]           Next level of care provider has access to [facility] Link:no  Safety Planning and Suicide Prevention discussed: Yes,  SPE completed with pt as attempts to contact pts sister were unsuccessful  Have you used any form of tobacco in the last 30 days? (Cigarettes, Smokeless Tobacco, Cigars, and/or Pipes): Yes  Has patient been referred to the Quitline?: Patient refused referral  Patient has been referred for addiction treatment: N/A  Mechele Dawley, LCSW 10/16/2019, 10:11 AM
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
## 2019-10-16 NOTE — Progress Notes (Signed)
Pt continues to be isolative to self and room. Denies SI, HI, AVh. Compliant with medications ordered for this shift. No request made by patient for prns.  No other concerns or request  Voiced. Encouragement and support offered. Safety checks maintained. Pt receptive and remains safe on unit with q 15 min checks.

## 2019-10-16 NOTE — Plan of Care (Signed)
  Problem: Coping Skills Goal: STG - Patient will identify 3 positive coping skills strategies to use post d/c within 5 recreation therapy group sessions Description: STG - Patient will identify 3 positive coping skills strategies to use post d/c within 5 recreation therapy group sessions 10/16/2019 1345 by Alveria Apley, LRT Outcome: Adequate for Discharge 10/16/2019 1345 by Alveria Apley, LRT Outcome: Adequate for Discharge

## 2019-10-16 NOTE — Progress Notes (Signed)
Recreation Therapy Notes  INPATIENT RECREATION TR PLAN  Patient Details Name: Tom Smith. MRN: [id number] DOB: 1965-09-30 Today's Date: 10/16/2019  Rec Therapy Plan Is patient appropriate for Therapeutic Recreation?: Yes Treatment times per week: At least 3 Estimated Length of Stay: 5-7 days TR Treatment/Interventions: Group participation (Comment)  Discharge Criteria Pt will be discharged from therapy if:: Discharged Treatment plan/goals/alternatives discussed and agreed upon by:: Patient/family  Discharge Summary Short term goals set: Patient will identify 3 positive coping skills strategies to use post d/c within 5 recreation therapy group sessions Short term goals met: Adequate for discharge Progress toward goals comments: Groups attended Which groups?: Goal setting, Other (Comment)(Problem Solving) Reason goals not met: N/A Therapeutic equipment acquired: N/A Reason patient discharged from therapy: Discharge from hospital Pt/family agrees with progress & goals achieved: Yes Date patient discharged from therapy: 10/16/19   Angeles Paolucci 10/16/2019, 1:45 PM

## 2019-10-16 NOTE — BHH Group Notes (Signed)
Overcoming Obstacles  10/16/2019 1PM  Type of Therapy and Topic:  Group Therapy:  Overcoming Obstacles  Participation Level:  Did Not Attend    Description of Group:    In this group patients will be encouraged to explore what they see as obstacles to their own wellness and recovery. They will be guided to discuss their thoughts, feelings, and behaviors related to these obstacles. The group will process together ways to cope with barriers, with attention given to specific choices patients can make. Each patient will be challenged to identify changes they are motivated to make in order to overcome their obstacles. This group will be process-oriented, with patients participating in exploration of their own experiences as well as giving and receiving support and challenge from other group members.   Therapeutic Goals: 1. Patient will identify personal and current obstacles as they relate to admission. 2. Patient will identify barriers that currently interfere with their wellness or overcoming obstacles.  3. Patient will identify feelings, thought process and behaviors related to these barriers. 4. Patient will identify two changes they are willing to make to overcome these obstacles:      Summary of Patient Progress     Therapeutic Modalities:   Cognitive Behavioral Therapy Solution Focused Therapy Motivational Interviewing Relapse Prevention Therapy    Lowella Dandy, MSW, LCSW 10/16/2019 2:33 PM

## 2019-10-16 NOTE — BHH Group Notes (Signed)
BHH Group Notes:  (Nursing/MHT/Case Management/Adjunct)  Date:  10/16/2019  Time:  3:31 PM  Type of Therapy:  Music Group  Participation Level:  Did Not Attend   Summary of Progress/Problems:  Tom Smith 10/16/2019, 3:31 PM

## 2019-10-16 NOTE — Progress Notes (Signed)
Patient denies SI/HI, denies A/V hallucinations. Patient verbalizes understanding of discharge instructions, follow up care and prescriptions. Patient given all belongings from [facility] locker. Patient escorted out by staff, transported by family. 

## 2019-10-16 NOTE — Progress Notes (Signed)
Recreation Therapy Notes  Date: 10/16/2019  Time: 9:30 am   Location: Craft room   Behavioral response: N/A   Intervention Topic: Stress Management   Discussion/Intervention: Patient did not attend group.   Clinical Observations/Feedback:  Patient did not attend group.   Savior Himebaugh LRT/CTRS        Tom Smith 10/16/2019 11:10 AM 

## 2019-10-16 NOTE — Discharge Summary (Signed)
Physician Discharge Summary Note  Patient:  Tom Smith. is an 54 y.o., male MRN:  [id number] DOB:  March 27, 1966 Patient phone:  [phone] (home)  Patient address:   [address] [place] [address],  Total Time spent with patient: 30 minutes  Date of Admission:  10/09/2019 Date of Discharge: 10/16/19  Reason for Admission:  54 year old man who was brought to the emergency room after taking a large overdose of Xanax.  Principal Problem: Bipolar depression [facility]) Discharge Diagnoses: Principal Problem:   Bipolar depression (HCC) Active Problems:   Benzodiazepine overdose   Past Psychiatric History: Patient is somewhat evasive about this.  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  He describes what probably sounds like a psychotic manic episode then.  Evidently after leaving the service he claims he never had any follow-up psychiatric treatment.  He says he never realized he had bipolar disorder until the manic episode he had this summer.  He does have a past history of suicide attempts including a recent history of suicide attempt.  Evidently does not know of any previous medicines he has been on.  Used to have a drinking problem but claims to have stopped months ago and not be abusing any drugs  Past Medical History:  Past Medical History:  Diagnosis Date  . Psoriatic arthritis (HCC)    History reviewed. No pertinent surgical history. Family History: History reviewed. No pertinent family history. Family Psychiatric  History: Sister is reported to have bipolar disorder Social History:  Social History   Substance and Sexual Activity  Alcohol Use Not Currently   Comment: UTA     Social History   Substance and Sexual Activity  Drug Use Yes   Comment: Overdosed on prescription pills 3/7    Social History   Socioeconomic History  . Marital status: Divorced    Spouse name: Not on file  . Number of children: Not on file  .  Years of education: Not on file  . Highest education level: Not on file  Occupational History  . Not on file  Tobacco Use  . Smoking status: Current Every Day Smoker    Packs/day: 1.00    Types: Cigarettes  . Smokeless tobacco: Never Used  Substance and Sexual Activity  . Alcohol use: Not Currently    Comment: UTA  . Drug use: Yes    Comment: Overdosed on prescription pills 3/7  . Sexual activity: Not on file  Other Topics Concern  . Not on file  Social History Narrative  . Not on file   Social Determinants of Health   Financial Resource Strain:   . Difficulty of Paying Living Expenses:   Food Insecurity:   . Worried About Programme researcher, broadcasting/film/video in the Last Year:   . Barista in the Last Year:   Transportation Needs:   . Freight forwarder (Medical):   Marland Kitchen Lack of Transportation (Non-Medical):   Physical Activity:   . Days of Exercise per Week:   . Minutes of Exercise per Session:   Stress:   . Feeling of Stress :   Social Connections:   . Frequency of Communication with Friends and Family:   . Frequency of Social Gatherings with Friends and Family:   . Attends Religious Services:   . Active Member of Clubs or Organizations:   . Attends Banker Meetings:   Marland Kitchen Marital Status:     Hospital Course:  Patient  remained on the [facility] unit for 6 days. The patient stabilized on medication and therapy. Patient was discharged on Seroquel 300 mg nightly. Patient has shown improvement with improved mood, affect, sleep, appetite, and interaction. Patient has attended group and participated. Patient has been seen in the day room interacting with peers and staff appropriately. Patient denies any SI/HI/AVH and contracts for safety. Patient agrees to follow up at [facility]. Patient is provided with prescriptions for their medications upon discharge.   Physical Findings: AIMS: Facial and Oral Movements Muscles of Facial Expression: None, normal Lips and Perioral Area: None,  normal Jaw: None, normal Tongue: None, normal,Extremity Movements Upper (arms, wrists, hands, fingers): None, normal Lower (legs, knees, ankles, toes): None, normal, Trunk Movements Neck, shoulders, hips: None, normal, Overall Severity Severity of abnormal movements (highest score from questions above): None, normal Incapacitation due to abnormal movements: None, normal Patient's awareness of abnormal movements (rate only patient's report): No Awareness, Dental Status Current problems with teeth and/or dentures?: No Does patient usually wear dentures?: No  CIWA:    COWS:     Musculoskeletal: Strength & Muscle Tone: within normal limits Gait & Station: normal Patient leans: N/A  Psychiatric Specialty Exam: Physical Exam  Nursing note and vitals reviewed. Constitutional: He is oriented to person, place, and time. He appears well-developed and well-nourished.  Cardiovascular: Normal rate.  Respiratory: Effort normal.  Musculoskeletal:        General: Normal range of motion.  Neurological: He is alert and oriented to person, place, and time.  Skin: Skin is warm.    Review of Systems  Constitutional: Negative.   HENT: Negative.   Eyes: Negative.   Respiratory: Negative.   Cardiovascular: Negative.   Gastrointestinal: Negative.   Genitourinary: Negative.   Musculoskeletal: Negative.   Skin: Negative.   Neurological: Negative.   Psychiatric/Behavioral: Negative.     Blood pressure 106/74, pulse 93, temperature 98.4 F (36.9 C), temperature source Oral, resp. rate 18, height 5\' 10"  (1.778 m), weight 68.5 kg, SpO2 99 %.Body mass index is 21.67 kg/m.   General Appearance: Casual  Eye Contact::  Good  Speech:  Clear and [id number]  Volume:  Normal  Mood:  Euthymic  Affect:  Congruent  Thought Process:  Goal Directed  Orientation:  Full (Time, Place, and Person)  Thought Content:  Logical  Suicidal Thoughts:  No  Homicidal Thoughts:  No  Memory:  Immediate;    Fair Recent;   Fair Remote;   Fair  Judgement:  Fair  Insight:  Fair  Psychomotor Activity:  Normal  Concentration:  Fair  Recall:  [employer] [place]  Language: Fair  Akathisia:  No  Handed:  Right  AIMS (if indicated):     Assets:  Desire for Improvement  Sleep:  Number of Hours: 9  Cognition: WNL  ADL's:  Intact   Have you used any form of tobacco in the last 30 days? (Cigarettes, Smokeless Tobacco, Cigars, and/or Pipes): Yes  Has this patient used any form of tobacco in the last 30 days? (Cigarettes, Smokeless Tobacco, Cigars, and/or Pipes) Yes, Yes, A prescription for an FDA-approved tobacco cessation medication was offered at discharge and the patient refused  Blood Alcohol level:  Lab Results  Component Value Date   ETH <10 93/79/0240    Metabolic Disorder Labs:  No results found for: HGBA1C, MPG No results found for: PROLACTIN No results found for: CHOL, TRIG, HDL, CHOLHDL, VLDL, LDLCALC  See Psychiatric Specialty Exam and Suicide Risk Assessment completed  by Attending Physician prior to discharge.  Discharge destination:  Home  Is patient on multiple antipsychotic therapies at discharge:  No   Has Patient had three or more failed trials of antipsychotic monotherapy by history:  No  Recommended Plan for Multiple Antipsychotic Therapies: NA  Discharge Instructions    Diet - low sodium heart healthy   Complete by: As directed    Increase activity slowly   Complete by: As directed      Allergies as of 10/16/2019   No Known Allergies     Medication List    TAKE these medications     Indication  QUEtiapine 300 MG tablet Commonly known as: SEROQUEL Take 1 tablet (300 mg total) by mouth at bedtime.  Indication: Depressive Phase of Manic-Depression      Follow-up Information    [facility] Follow up on 10/18/2019.   Why: You have an appointment scheduled for 10/18/19 at 12:30PM. This is in person, please wear a mask and bring photo  id and discharge paperwork. Thank You! Contact information: [address] [place] [address] [phone]           Follow-up recommendations:  Continue activity as tolerated. Continue diet as recommended by your PCP. Ensure to keep all appointments with outpatient providers.  Comments:  Patient is instructed prior to discharge to: Take all medications as prescribed by his/her mental healthcare provider. Report any adverse effects and or reactions from the medicines to his/her outpatient provider promptly. Patient has been instructed & cautioned: To not engage in alcohol and or illegal drug use while on prescription medicines. In the event of worsening symptoms, patient is instructed to call the crisis hotline, 911 and or go to the nearest ED for appropriate evaluation and treatment of symptoms. To follow-up with his/her primary care provider for your other medical issues, concerns and or health care needs.    Signed: Gerlene Burdock Rorey Bisson, FNP 10/16/2019, 11:23 AM

## 2019-10-25 ENCOUNTER — Inpatient Hospital Stay
Admission: AD | Admit: 2019-10-25 | Discharge: 2019-10-30 | DRG: 885 | Disposition: A | Discharge status: Home / Self Care | Payer: Self-pay | Source: Intra-hospital | Attending: Psychiatry | Admitting: Psychiatry

## 2019-10-25 ENCOUNTER — Emergency Department
Admission: EM | Admit: 2019-10-25 | Discharge: 2019-10-25 | Disposition: A | Discharge status: Other Acute Inpatient Hospital | Payer: Self-pay | Attending: Emergency Medicine | Admitting: Emergency Medicine

## 2019-10-25 ENCOUNTER — Emergency Department: Payer: Self-pay

## 2019-10-25 ENCOUNTER — Other Ambulatory Visit: Payer: Self-pay

## 2019-10-25 DIAGNOSIS — F329 Major depressive disorder, single episode, unspecified: Secondary | ICD-10-CM | POA: Diagnosis present

## 2019-10-25 DIAGNOSIS — F3130 Bipolar disorder, current episode depressed, mild or moderate severity, unspecified: Principal | ICD-10-CM | POA: Diagnosis present

## 2019-10-25 DIAGNOSIS — F32A Depression, unspecified: Secondary | ICD-10-CM | POA: Diagnosis present

## 2019-10-25 DIAGNOSIS — T424X1A Poisoning by benzodiazepines, accidental (unintentional), initial encounter: Secondary | ICD-10-CM | POA: Diagnosis present

## 2019-10-25 DIAGNOSIS — L4050 Arthropathic psoriasis, unspecified: Secondary | ICD-10-CM | POA: Diagnosis present

## 2019-10-25 DIAGNOSIS — Z20822 Contact with and (suspected) exposure to covid-19: Secondary | ICD-10-CM | POA: Insufficient documentation

## 2019-10-25 DIAGNOSIS — T43592D Poisoning by other antipsychotics and neuroleptics, intentional self-harm, subsequent encounter: Secondary | ICD-10-CM

## 2019-10-25 DIAGNOSIS — G4700 Insomnia, unspecified: Secondary | ICD-10-CM | POA: Diagnosis present

## 2019-10-25 DIAGNOSIS — T43501A Poisoning by unspecified antipsychotics and neuroleptics, accidental (unintentional), initial encounter: Secondary | ICD-10-CM

## 2019-10-25 DIAGNOSIS — F17210 Nicotine dependence, cigarettes, uncomplicated: Secondary | ICD-10-CM | POA: Diagnosis present

## 2019-10-25 DIAGNOSIS — F319 Bipolar disorder, unspecified: Secondary | ICD-10-CM | POA: Diagnosis present

## 2019-10-25 DIAGNOSIS — F323 Major depressive disorder, single episode, severe with psychotic features: Secondary | ICD-10-CM | POA: Insufficient documentation

## 2019-10-25 DIAGNOSIS — T43592A Poisoning by other antipsychotics and neuroleptics, intentional self-harm, initial encounter: Secondary | ICD-10-CM | POA: Insufficient documentation

## 2019-10-25 DIAGNOSIS — Z818 Family history of other mental and behavioral disorders: Secondary | ICD-10-CM

## 2019-10-25 DIAGNOSIS — F332 Major depressive disorder, recurrent severe without psychotic features: Secondary | ICD-10-CM | POA: Diagnosis present

## 2019-10-25 DIAGNOSIS — T50902A Poisoning by unspecified drugs, medicaments and biological substances, intentional self-harm, initial encounter: Secondary | ICD-10-CM

## 2019-10-25 LAB — CBC WITH DIFFERENTIAL/PLATELET
Abs Immature Granulocytes: 0.06 10*3/uL (ref 0.00–0.07)
Basophils Absolute: 0 10*3/uL (ref 0.0–0.1)
Basophils Relative: 0 %
Eosinophils Absolute: 0 10*3/uL (ref 0.0–0.5)
Eosinophils Relative: 0 %
HCT: 37.1 % — ABNORMAL LOW (ref 39.0–52.0)
Hemoglobin: 13.2 g/dL (ref 13.0–17.0)
Immature Granulocytes: 0 %
Lymphocytes Relative: 10 %
Lymphs Abs: 1.6 10*3/uL (ref 0.7–4.0)
MCH: 32.7 pg (ref 26.0–34.0)
MCHC: 35.6 g/dL (ref 30.0–36.0)
MCV: 91.8 fL (ref 80.0–100.0)
Monocytes Absolute: 1 10*3/uL (ref 0.1–1.0)
Monocytes Relative: 6 %
Neutro Abs: 13.5 10*3/uL — ABNORMAL HIGH (ref 1.7–7.7)
Neutrophils Relative %: 84 %
Platelets: 280 10*3/uL (ref 150–400)
RBC: 4.04 MIL/uL — ABNORMAL LOW (ref 4.22–5.81)
RDW: 12.5 % (ref 11.5–15.5)
WBC: 16.2 10*3/uL — ABNORMAL HIGH (ref 4.0–10.5)
nRBC: 0 % (ref 0.0–0.2)

## 2019-10-25 LAB — COMPREHENSIVE METABOLIC PANEL
ALT: 26 U/L (ref 0–44)
AST: 23 U/L (ref 15–41)
Albumin: 4 g/dL (ref 3.5–5.0)
Alkaline Phosphatase: 84 U/L (ref 38–126)
Anion gap: 10 (ref 5–15)
BUN: 22 mg/dL — ABNORMAL HIGH (ref 6–20)
CO2: 26 mmol/L (ref 22–32)
Calcium: 9.1 mg/dL (ref 8.9–10.3)
Chloride: 103 mmol/L (ref 98–111)
Creatinine, Ser: 1.28 mg/dL — ABNORMAL HIGH (ref 0.61–1.24)
GFR calc Af Amer: >60 mL/min (ref >60)
GFR calc non Af Amer: >60 mL/min (ref >60)
Glucose, Bld: 139 mg/dL — ABNORMAL HIGH (ref 70–99)
Potassium: 3.6 mmol/L (ref 3.5–5.1)
Sodium: 139 mmol/L (ref 135–145)
Total Bilirubin: 1 mg/dL (ref 0.3–1.2)
Total Protein: 6.8 g/dL (ref 6.5–8.1)

## 2019-10-25 LAB — SALICYLATE LEVEL: Salicylate Lvl: <7 mg/dL — ABNORMAL LOW (ref 7.0–30.0)

## 2019-10-25 LAB — URINE DRUG SCREEN, QUALITATIVE (ARMC ONLY)
Amphetamines, Ur Screen: NOT DETECTED
Barbiturates, Ur Screen: NOT DETECTED
Benzodiazepine, Ur Scrn: NOT DETECTED
Cannabinoid 50 Ng, Ur ~~LOC~~: NOT DETECTED
Cocaine Metabolite,Ur ~~LOC~~: NOT DETECTED
MDMA (Ecstasy)Ur Screen: NOT DETECTED
Methadone Scn, Ur: NOT DETECTED
Opiate, Ur Screen: NOT DETECTED
Phencyclidine (PCP) Ur S: NOT DETECTED
Tricyclic, Ur Screen: POSITIVE — AB

## 2019-10-25 LAB — RESPIRATORY PANEL BY RT PCR (FLU A&B, COVID)
Influenza A by PCR: NEGATIVE
Influenza B by PCR: NEGATIVE
SARS Coronavirus 2 by RT PCR: NEGATIVE

## 2019-10-25 LAB — ACETAMINOPHEN LEVEL: Acetaminophen (Tylenol), Serum: <10 ug/mL — ABNORMAL LOW (ref 10–30)

## 2019-10-25 LAB — MAGNESIUM: Magnesium: 1.9 mg/dL (ref 1.7–2.4)

## 2019-10-25 LAB — ETHANOL: Alcohol, Ethyl (B): <10 mg/dL (ref <10)

## 2019-10-25 MED ORDER — SODIUM CHLORIDE 0.9 % IV BOLUS
1000.0000 mL | Freq: Once | INTRAVENOUS | Status: AC
  Administered 2019-10-25: 1000 mL via INTRAVENOUS

## 2019-10-25 NOTE — ED Notes (Addendum)
Report received from [facility] -  Patient observed lying in bed with eyes closed  Even, unlabored respirations observed   NAD pt appears to be sleeping  Cardiac monitoring continues  VSS I will continue to monitor along with every 15 minute visual observations

## 2019-10-25 NOTE — ED Triage Notes (Signed)
Patient arrived by [facility] after patient was found in wood. Per EMS and [employer] patient had took unknown amount of 300 mg seroquel and then went running into wood. Police was called. [place] and [facility] looked for patient and was found over 2 miles from home. Patient had shoe missing and clothing wet. Upon arriving to ER patient not responding to questions. Patient will open eyes when spoken to. Patient in room 20 with medical equipment to monitor.

## 2019-10-25 NOTE — ED Notes (Signed)
TTS talking to pt. In room quad #20A

## 2019-10-25 NOTE — ED Notes (Signed)
Patient is to be admitted to [facility] by Psychiatric Nurse Practitioner Gillermo Murdoch.  Attending Physician will be Dr. Toni Amend.   Patient has been assigned to room 309, by [facility] Charge Nurse .   Intake Paper Work has been signed and placed on patient chart.  ER staff is aware of the admission:  ER Secretary    Dr. ER MD   Susy Frizzle  Patient's Nurse   Patient Access.

## 2019-10-25 NOTE — ED Provider Notes (Signed)
[facility] Emergency Department Provider Note   ____________________________________________   First MD Initiated Contact with Patient 10/25/19 [phone]     (approximate)  I have reviewed the triage vital signs and the nursing notes.   HISTORY  Chief Complaint Overdose  Level V caveat: Limited by decreased LOC  HPI Tom Smith. is a 54 y.o. male brought to the ED via EMS and police status post intentional Seroquel overdose.  Unknown quantity and unknown time of ingestion because patient then ran away into the woods.  Police finally found him and brought him in.  Patient had recent psychiatric hospitalization for similar overdose.  History is limited secondary to patient's drowsiness.       Past Medical History:  Diagnosis Date  . Psoriatic arthritis [facility])     Patient Active Problem List   Diagnosis Date Noted  . Bipolar depression ([place]) 10/09/2019  . Benzodiazepine overdose 10/09/2019  . Depression 10/09/2019    History reviewed. No pertinent surgical history.  Prior to Admission medications   Medication Sig Start Date End Date Taking? Authorizing Provider  QUEtiapine (SEROQUEL) 300 MG tablet Take 1 tablet (300 mg total) by mouth at bedtime. 10/15/19   Clapacs, Madie Reno, MD    Allergies Patient has no known allergies.  History reviewed. No pertinent family history.  Social History Social History   Tobacco Use  . Smoking status: Current Every Day Smoker    Packs/day: 1.00    Types: Cigarettes  . Smokeless tobacco: Never Used  Substance Use Topics  . Alcohol use: Not Currently    Comment: UTA  . Drug use: Yes    Comment: Overdosed on prescription pills 3/7    Review of Systems  Constitutional: No fever/chills Eyes: No visual changes. ENT: No sore throat. Cardiovascular: Denies chest pain. Respiratory: Denies shortness of breath. Gastrointestinal: No abdominal pain.  No nausea, no vomiting.  No diarrhea.  No  constipation. Genitourinary: Negative for dysuria. Musculoskeletal: Negative for back pain. Skin: Negative for rash. Neurological: Positive for drowsiness.  Negative for headaches, focal weakness or numbness. Psychiatric:  Positive for intentional overdose.  ____________________________________________   PHYSICAL EXAM:  VITAL SIGNS: ED Triage Vitals  Enc Vitals Group     BP      Pulse      Resp      Temp      Temp src      SpO2      Weight      Height      Head Circumference      Peak Flow      Pain Score      Pain Loc      Pain Edu?      Excl. in [place]?     Constitutional: Somnolent.  Disheveled appearing and in no acute distress. Eyes: Conjunctivae are normal. PERRL. EOMI. Head: Atraumatic. Nose: Atraumatic. Mouth/Throat: Mucous membranes are moist.  No dental malocclusion. Neck: No stridor.  No cervical spine tenderness to palpation. Cardiovascular: Normal rate, regular rhythm. Grossly normal heart sounds.  Good peripheral circulation. Respiratory: Normal respiratory effort.  No retractions. Lungs CTAB. Gastrointestinal: Soft and nontender to light or deep palpation. No distention. No abdominal bruits. No CVA tenderness. Musculoskeletal: No lower extremity tenderness nor edema.  No joint effusions. Neurologic: Opens eyes spontaneously.  MAEx4.  Skin:  Skin is warm, dry and intact. No rash noted. Psychiatric: Unable to assess.  ____________________________________________   LABS (all labs ordered are listed, but only abnormal results  are displayed)  Labs Reviewed  CBC WITH DIFFERENTIAL/PLATELET - Abnormal; Notable for the following components:      Result Value   WBC 16.2 (*)    RBC 4.04 (*)    HCT 37.1 (*)    Neutro Abs 13.5 (*)    All other components within normal limits  COMPREHENSIVE METABOLIC PANEL - Abnormal; Notable for the following components:   Glucose, Bld 139 (*)    BUN 22 (*)    Creatinine, Ser 1.28 (*)    All other components within normal  limits  ACETAMINOPHEN LEVEL - Abnormal; Notable for the following components:   Acetaminophen (Tylenol), Serum <10 (*)    All other components within normal limits  SALICYLATE LEVEL - Abnormal; Notable for the following components:   Salicylate Lvl <7.0 (*)    All other components within normal limits  RESPIRATORY PANEL BY RT PCR (FLU A&B, COVID)  ETHANOL  MAGNESIUM  URINE DRUG SCREEN, QUALITATIVE ([facility] ONLY)   ____________________________________________  EKG  ED ECG REPORT I, Deolinda Frid J, the attending physician, personally viewed and interpreted this ECG.   Date: 10/25/2019  EKG Time: 0246  Rate: 111  Rhythm: sinus tachycardia  Axis: Normal  Intervals:none  ST&T Change: Nonspecific  ____________________________________________  RADIOLOGY  ED MD interpretation: No ICH; no acute cardiopulmonary process  Official radiology report(s): CT Head Wo Contrast  Result Date: 10/25/2019 CLINICAL DATA:  Overdose EXAM: CT HEAD WITHOUT CONTRAST TECHNIQUE: Contiguous axial images were obtained from the base of the skull through the vertex without intravenous contrast. COMPARISON:  October 08, 2019 FINDINGS: Brain: No evidence of acute territorial infarction, hemorrhage, hydrocephalus,extra-axial collection or mass lesion/mass effect. Normal gray-white differentiation. Ventricles are normal in size and contour. Vascular: No hyperdense vessel or unexpected calcification. Skull: The skull is intact. No fracture or focal lesion identified. Sinuses/Orbits: Ethmoid air cell, maxillary sinus and frontal sinus mucosal thickening seen. The orbits and globes intact. Other: None IMPRESSION: No acute intracranial abnormality. Chronic sinusitis Electronically Signed   By: Jonna Clark M.D.   On: 10/25/2019 03:52   DG Chest Port 1 View  Result Date: 10/25/2019 CLINICAL DATA:  Overdose EXAM: PORTABLE CHEST 1 VIEW COMPARISON:  None. FINDINGS: Heart and mediastinal contours are within normal limits. No focal  opacities or effusions. No acute bony abnormality. IMPRESSION: No active disease. Electronically Signed   By: Charlett Nose M.D.   On: 10/25/2019 03:09    ____________________________________________   PROCEDURES  Procedure(s) performed (including Critical Care):  .1-3 Lead EKG Interpretation Performed by: Irean Hong, MD Authorized by: Irean Hong, MD     Interpretation: abnormal     ECG rate:  116   ECG rate assessment: tachycardic     Rhythm: sinus tachycardia     Ectopy: none     Conduction: normal   Comments:     Patient was placed on the cardiac monitor to monitor for arrhythmias given his intentional drug overdose   CRITICAL CARE Performed by: Irean Hong   Total critical care time: 45 minutes  Critical care time was exclusive of separately billable procedures and treating other patients.  Critical care was necessary to treat or prevent imminent or life-threatening deterioration.  Critical care was time spent personally by me on the following activities: development of treatment plan with patient and/or surrogate as well as nursing, discussions with consultants, evaluation of patient's response to treatment, examination of patient, obtaining history from patient or surrogate, ordering and performing treatments and interventions, ordering and review  of laboratory studies, ordering and review of radiographic studies, pulse oximetry and re-evaluation of patient's condition.   ____________________________________________   INITIAL IMPRESSION / ASSESSMENT AND PLAN / ED COURSE  As part of my medical decision making, I reviewed the following data within the electronic MEDICAL RECORD NUMBER Nursing notes reviewed and incorporated, Labs reviewed, EKG interpreted, Old chart reviewed, Radiograph reviewed and Notes from prior ED visits     Tom Smith. was evaluated in Emergency Department on 10/25/2019 for the symptoms described in the history of present illness. He was  evaluated in the context of the global COVID-19 pandemic, which necessitated consideration that the patient might be at risk for infection with the SARS-CoV-2 virus that causes COVID-19. Institutional protocols and algorithms that pertain to the evaluation of patients at risk for COVID-19 are in a state of rapid change based on information released by regulatory bodies including the CDC and federal and state organizations. These policies and algorithms were followed during the patient's care in the ED.    54 year old male with bipolar disorder who presents status post intentional Seroquel overdose.  Will place patient under IVC for his safety.  Check toxilogical lab work and urine, checks x-ray, CT head.  Initiate IV fluid resuscitation.  Will contact poison control.   Clinical Course as of Oct 24 709  Wed Oct 25, 2019  0321 Poison control recommends 8-hour observation.  Watch QTC.  Supportive care.  IV fluids; epinephrine for hypotension.   [JS]  0501 Patient resting in no acute distress.  Will administer second liter IV fluids.   [JS]  0708 Care transferred to Dr. Mayford Knife at change of shift. He will be medically cleared at 8 hours.   [JS]    Clinical Course User Index [JS] Irean Hong, MD     ____________________________________________   FINAL CLINICAL IMPRESSION(S) / ED DIAGNOSES  Final diagnoses:  Intentional drug overdose, initial encounter [facility])     ED Discharge Orders    None       Note:  This document was prepared using Dragon voice recognition software and may include unintentional dictation errors.   Irean Hong, MD 10/25/19 [phone]

## 2019-10-25 NOTE — BH Assessment (Signed)
Assessment Note  Tom Smith. is an 54 y.o. male.  who presents to the ER due to taking an intentional overdose on his medication. Pt recent discharge from [facility] for the same. Pt states that he took 29 tablet of Seroquel in attempt to end his life. He reports multiple depressive sx including; lack of motivation, inability to maintain restful sleep and irritability. Writer is concerned for Pts safety and the safety of others. Pt  cannot contract for safety outside of the hospital. Pt requires inpatient psychiatric hospitalization for safety common stabilization, and medication management.   Diagnosis: Major Depressive Disorder   Past Medical History:  Past Medical History:  Diagnosis Date  . Psoriatic arthritis (HCC)     History reviewed. No pertinent surgical history.  Family History: History reviewed. No pertinent family history.  Social History:  reports that he has been smoking cigarettes. He has been smoking about 1.00 pack per day. He has never used smokeless tobacco. He reports previous alcohol use. He reports current drug use.  Additional Social History:  Alcohol / Drug Use Pain Medications: See PTA Prescriptions: See PTA Over the Counter: See PTA History of alcohol / drug use?: Yes Longest period of sobriety (when/how long): Unable to quantify Substance #1 Name of Substance 1: Cannabis 1 - Last Use / Amount: Unknown  CIWA: CIWA-Ar BP: 130/85 Pulse Rate: 87 COWS:    Allergies: No Known Allergies  Home Medications: (Not in a hospital admission)   OB/GYN Status:  No LMP for male patient.  General Assessment Data TTS Assessment: In system Is this a Tele or Face-to-Face Assessment?: Face-to-Face Is this an Initial Assessment or a Re-assessment for this encounter?: Initial Assessment Patient Accompanied by:: N/A Language Other than English: No Living Arrangements: Other (Comment) What gender do you identify as?: Male Living Arrangements: Parent Can pt return to  current living arrangement?: Yes Admission Status: Involuntary Petitioner: Other Is patient capable of signing voluntary admission?: No Referral Source: Self/Family/Friend Insurance type: None  Medical Screening Exam [facility] Walk-in ONLY) Medical Exam completed: Yes  Crisis Care Plan Living Arrangements: Parent Legal Guardian: (Self) Name of Psychiatrist: none  Name of Therapist: Reports of none  Education Status Is patient currently in school?: No Highest grade of school patient has completed: 12th Is the patient employed, unemployed or receiving disability?: Unemployed, Receiving disability income  Risk to self with the past 6 months Suicidal Ideation: Yes-Currently Present Has patient been a risk to self within the past 6 months prior to admission? : Yes Suicidal Intent: Yes-Currently Present Has patient had any suicidal intent within the past 6 months prior to admission? : Yes Is patient at risk for suicide?: Yes Suicidal Plan?: Yes-Currently Present Has patient had any suicidal plan within the past 6 months prior to admission? : Yes Specify Current Suicidal Plan: Od Access to Means: Yes Specify Access to Suicidal Means: has medication  What has been your use of drugs/alcohol within the last 12 months?: hs of THC use  Previous Attempts/Gestures: Yes How many times?: 1 Triggers for Past Attempts: Unknown Intentional Self Injurious Behavior: None Family Suicide History: No Recent stressful life event(s): Conflict (Comment) Persecutory voices/beliefs?: No Depression: Yes Depression Symptoms: Despondent, Feeling angry/irritable, Feeling worthless/self pity, Loss of interest in usual pleasures Substance abuse history and/or treatment for substance abuse?: Yes Suicide prevention information given to non-admitted patients: Not applicable  Risk to Others within the past 6 months Homicidal Ideation: No Does patient have any lifetime risk of violence toward others beyond  the six  months prior to admission? : No Thoughts of Harm to Others: No Current Homicidal Intent: No Current Homicidal Plan: No Access to Homicidal Means: No History of harm to others?: No Assessment of Violence: None Noted Does patient have access to weapons?: No Criminal Charges Pending?: No Does patient have a court date: No Is patient on probation?: No  Psychosis Hallucinations: None noted  Mental Status Report Appearance/Hygiene: In scrubs Eye Contact: Poor Motor Activity: Freedom of movement Speech: Logical/coherent, Soft Level of Consciousness: Alert Mood: Depressed Affect: Depressed Anxiety Level: Moderate Thought Processes: Relevant Judgement: Impaired Orientation: Person, Time, Place, Situation Obsessive Compulsive Thoughts/Behaviors: Minimal  Cognitive Functioning Concentration: Fair Memory: Recent Intact, Remote Intact Is patient IDD: No Insight: Poor Impulse Control: Poor Appetite: Poor Have you had any weight changes? : No Change Sleep: Increased Total Hours of Sleep: 8 Vegetative Symptoms: Unable to Assess  ADLScreening [facility] Assessment Services) Patient's cognitive ability adequate to safely complete daily activities?: Yes Patient able to express need for assistance with ADLs?: Yes Independently performs ADLs?: Yes (appropriate for developmental age)  Prior Inpatient Therapy Prior Inpatient Therapy: Yes Prior Therapy Dates: 10/09/19 Prior Therapy Facilty/Provider(s): [facility] Reason for Treatment: Depression   Prior Outpatient Therapy Prior Outpatient Therapy: Yes Prior Therapy Dates: 2008 Prior Therapy Facilty/Provider(s): Unable to remember the name Reason for Treatment: Depression Does patient have an ACCT team?: No Does patient have Intensive In-House Services?  : No Does patient have [facility] services? : No Does patient have P4CC services?: No  ADL Screening (condition at time of admission) Patient's cognitive ability adequate to safely complete  daily activities?: Yes Patient able to express need for assistance with ADLs?: Yes Independently performs ADLs?: Yes (appropriate for developmental age)       Abuse/Neglect Assessment (Assessment to be complete while patient is alone) Abuse/Neglect Assessment Can Be Completed: Yes Physical Abuse: Denies Verbal Abuse: Denies Sexual Abuse: Denies Exploitation of patient/patient's resources: Denies Self-Neglect: Denies Values / Beliefs Cultural Requests During Hospitalization: None Spiritual Requests During Hospitalization: None Consults Spiritual Care Consult Needed: No Transition of Care Team Consult Needed: No Advance Directives (For Healthcare) Does Patient Have a Medical Advance Directive?: No Would patient like information on creating a medical advance directive?: No - Patient declined          Disposition:  Disposition Initial Assessment Completed for this Encounter: Yes  On Site Evaluation by:   Reviewed with Physician:    Laretta Alstrom 10/25/2019 10:39 PM

## 2019-10-25 NOTE — ED Notes (Signed)
Pt. Awake laying in bed.  Pt. Indicated he wanted to stretch and use bathroom, pt. Able to get up from bed and ambulate to bathroom.  Pt. Requested and was given meal tray and drink.

## 2019-10-25 NOTE — ED Notes (Signed)
Poison Control notified. Spoke with Patty. Per patty patient should be observed for 8 hours, Repeat EKG to look for elevated QTC. If QTC over 500 will need mag and potassium replacement. Start fluids for ST if become hypotensive will need fluids and epi drip. Patty will call back later to check on patient. EDP made aware of recommendations.

## 2019-10-25 NOTE — ED Notes (Signed)
IVC/ Pending consult re-evaluation in Am

## 2019-10-25 NOTE — Consult Note (Signed)
  Patient is a 54 year old male who presented under IVC due to reports of potential suicide attempt. Upon evaluation this morning patient was illogical in his speech and somnolent during the course of the interview.  It was decided that patient will be reevaluated when he is more medically stable

## 2019-10-25 NOTE — Consult Note (Signed)
[facility] Face-to-Face Psychiatry Consult   Reason for Consult: Intentional overdose Referring Physician: Dr. Dolores Frame Patient Identification: Tom Smith. MRN:  [id number] Principal Diagnosis: <principal problem not specified> Diagnosis:  Active Problems:   Bipolar depression (HCC)   Benzodiazepine overdose   Depression   Total Time spent with patient: 1 hour  Subjective: "I am not Tom Smith. is a 54 y.o. male patient presented to [facility] ED via EMS/law enforcement under involuntary commitment status (IVC). Per ED triage nursing note, Per EMS and [facility], the patient had taken an unknown amount of 300 mg Seroquel and then went running into the wood. The police were called. [place], [place], and [facility] looked for the patient and were found over 2 miles from home. The patient had shoes missing and clothing wet upon arriving at ER patient not responding to questions. The patient will open his eyes when spoken to--patient in room 20 with medical equipment to monitor.  It was reported that the patient had taken an overdose of medications (Seroquel). The patient voiced that he took Seroquel, unsure of the amount.  The patient admits to intentionally suicidal intent.  "I want this life to be over.  I find no joy in living."  He talks about how he has felt like it is time to "check out" and that he has never felt so empty and hopeless. The patient says he has felt terrible and hopeless for months. He voiced that he feels negative all the time but also irritable.  He sleeps poorly at night. The patient says he used to drink, but he stopped last August and has stayed off the alcohol, but his mood, if anything, has gotten worse.  The patient voiced that he felt better when he drank than not drinking.  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    The patient was seen face-to-face by this  provider; chart reviewed and consulted with Dr. Roxan Hockey on 10/25/2019 due to the patient's care. It was discussed with the EDP that the patient does meet the criteria to be admitted to the psychiatric inpatient unit.  On evaluation, the patient is alert and oriented x 1-2, calm and logical, but cooperative, and mood-congruent with affect.  The patient does not appear to be responding to internal and external stimuli. He is presenting with some delusional thinking. The patient denies auditory or visual hallucinations. The patient admits to suicidal ideations. The patient is not presenting with any psychotic or paranoid behaviors. During an encounter with the patient, he was unable to answer most questions appropriately.    Plan: The patient is a safety risk to self and does  require psychiatric inpatient admission for stabilization and treatment.   HPI: Per Dr. Dolores Frame: Tom Smith. is a 54 y.o. male brought to the ED via EMS and police status post intentional Seroquel overdose.  Unknown quantity and unknown time of ingestion because patient then ran away into the woods.  Police finally found him and brought him in.  Patient had recent psychiatric hospitalization for similar overdose.  History is limited secondary to patient's drowsiness.  Risk to Self:   Yes Risk to Others:   No Prior Inpatient Therapy:   Yes Prior Outpatient Therapy:  Yes  Past Medical History:  Past Medical History:  Diagnosis Date  . Psoriatic arthritis (HCC)    History reviewed. No pertinent surgical history.  Family History: History reviewed. No pertinent family history. Family Psychiatric  History: Patient says there are several people in his family with serious mental health problems including a cousin who killed himself. Social History:  Social History   Substance and Sexual Activity  Alcohol Use Not Currently   Comment: UTA     Social History   Substance and Sexual Activity  Drug Use Yes   Comment: Overdosed  on prescription pills 3/7    Social History   Socioeconomic History  . Marital status: Divorced    Spouse name: Not on file  . Number of children: Not on file  . Years of education: Not on file  . Highest education level: Not on file  Occupational History  . Not on file  Tobacco Use  . Smoking status: Current Every Day Smoker    Packs/day: 1.00    Types: Cigarettes  . Smokeless tobacco: Never Used  Substance and Sexual Activity  . Alcohol use: Not Currently    Comment: UTA  . Drug use: Yes    Comment: Overdosed on prescription pills 3/7  . Sexual activity: Not on file  Other Topics Concern  . Not on file  Social History Narrative  . Not on file   Social Determinants of Health   Financial Resource Strain:   . Difficulty of Paying Living Expenses:   Food Insecurity:   . Worried About Programme researcher, broadcasting/film/video in the Last Year:   . Barista in the Last Year:   Transportation Needs:   . Freight forwarder (Medical):   Marland Kitchen Lack of Transportation (Non-Medical):   Physical Activity:   . Days of Exercise per Week:   . Minutes of Exercise per Session:   Stress:   . Feeling of Stress :   Social Connections:   . Frequency of Communication with Friends and Family:   . Frequency of Social Gatherings with Friends and Family:   . Attends Religious Services:   . Active Member of Clubs or Organizations:   . Attends Banker Meetings:   Marland Kitchen Marital Status:    Additional Social History:    Allergies:  No Known Allergies  Labs:  Results for orders placed or performed during the hospital encounter of 10/25/19 (from the past 48 hour(s))  CBC with Differential     Status: Abnormal   Collection Time: 10/25/19  2:47 AM  Result Value Ref Range   WBC 16.2 (H) 4.0 - 10.5 K/uL   RBC 4.04 (L) 4.22 - 5.81 MIL/uL   Hemoglobin 13.2 13.0 - 17.0 g/dL   HCT 17.0 (L) 01.7 - 49.4 %   MCV 91.8 80.0 - 100.0 fL   MCH 32.7 26.0 - 34.0 pg   MCHC 35.6 30.0 - 36.0 g/dL   RDW 49.6  75.9 - 16.3 %   Platelets 280 150 - 400 K/uL   nRBC 0.0 0.0 - 0.2 %   Neutrophils Relative % 84 %   Neutro Abs 13.5 (H) 1.7 - 7.7 K/uL   Lymphocytes Relative 10 %   Lymphs Abs 1.6 0.7 - 4.0 K/uL   Monocytes Relative 6 %   Monocytes Absolute 1.0 0.1 - 1.0 K/uL   Eosinophils Relative 0 %   Eosinophils Absolute 0.0 0.0 - 0.5 K/uL   Basophils Relative 0 %   Basophils Absolute 0.0 0.0 - 0.1 K/uL   Immature Granulocytes 0 %   Abs Immature Granulocytes 0.06 0.00 - 0.07 K/uL    Comment: Performed  at [facility], [address] [place], [place] [address]  Comprehensive metabolic panel     Status: Abnormal   Collection Time: 10/25/19  2:47 AM  Result Value Ref Range   Sodium 139 135 - 145 mmol/L   Potassium 3.6 3.5 - 5.1 mmol/L   Chloride 103 98 - 111 mmol/L   CO2 26 22 - 32 mmol/L   Glucose, Bld 139 (H) 70 - 99 mg/dL    Comment: Glucose reference range applies only to samples taken after fasting for at least 8 hours.   BUN 22 (H) 6 - 20 mg/dL   Creatinine, Ser 1.28 (H) 0.61 - 1.24 mg/dL   Calcium 9.1 8.9 - 10.3 mg/dL   Total Protein 6.8 6.5 - 8.1 g/dL   Albumin 4.0 3.5 - 5.0 g/dL   AST 23 15 - 41 U/L   ALT 26 0 - 44 U/L   Alkaline Phosphatase 84 38 - 126 U/L   Total Bilirubin 1.0 0.3 - 1.2 mg/dL   GFR calc non Af Amer >60 >60 mL/min   GFR calc Af Amer >60 >60 mL/min   Anion gap 10 5 - 15    Comment: Performed at [facility], [address] [place], [place] [address]  Ethanol     Status: None   Collection Time: 10/25/19  2:47 AM  Result Value Ref Range   Alcohol, Ethyl (B) <10 <10 mg/dL    Comment: (NOTE) Lowest detectable limit for serum alcohol is 10 mg/dL. For medical purposes only. Performed at [facility], [address] [place], [place] [address]   Acetaminophen level     Status: Abnormal   Collection Time: 10/25/19  2:47 AM  Result Value Ref Range   Acetaminophen (Tylenol), Serum <10 (L) 10 - 30 ug/mL    Comment: (NOTE) Therapeutic  concentrations vary significantly. A range of 10-30 ug/mL  may be an effective concentration for many patients. However, some  are best treated at concentrations outside of this range. Acetaminophen concentrations >150 ug/mL at 4 hours after ingestion  and >50 ug/mL at 12 hours after ingestion are often associated with  toxic reactions. Performed at [facility], [address] [place], [place] [address]   Salicylate level     Status: Abnormal   Collection Time: 10/25/19  2:47 AM  Result Value Ref Range   Salicylate Lvl <8.1 (L) 7.0 - 30.0 mg/dL    Comment: Performed at [facility], [address], [place], [place] [address]  Respiratory Panel by RT PCR (Flu A&B, Covid) - Nasopharyngeal Swab     Status: None   Collection Time: 10/25/19  2:47 AM   Specimen: Nasopharyngeal Swab  Result Value Ref Range   SARS Coronavirus 2 by RT PCR NEGATIVE NEGATIVE    Comment: (NOTE) SARS-CoV-2 target nucleic acids are NOT DETECTED. The SARS-CoV-2 RNA is generally detectable in upper respiratoy specimens during the acute phase of infection. The lowest concentration of SARS-CoV-2 viral copies this assay can detect is 131 copies/mL. A negative result does not preclude SARS-Cov-2 infection and should not be used as the sole basis for treatment or other patient management decisions. A negative result may occur with  improper specimen collection/handling, submission of specimen other than nasopharyngeal swab, presence of viral mutation(s) within the areas targeted by this assay, and inadequate number of viral copies (<131 copies/mL). A negative result must be combined with clinical observations, patient history, and epidemiological information. The expected result is Negative. Fact Sheet  for Patients:  https://www.moore.com/https://www.fda.gov/media/142436/download Fact Sheet for Healthcare Providers:  https://www.young.biz/https://www.fda.gov/media/142435/download This test is not yet ap proved or cleared by the [place] FDA and  has been authorized for detection and/or diagnosis of SARS-CoV-2 by FDA under an Emergency Use Authorization (EUA). This EUA will remain  in effect (meaning this test can be used) for the duration of the COVID-19 declaration under Section 564(b)(1) of the Act, 21 U.S.C. section 360bbb-3(b)(1), unless the authorization is terminated or revoked sooner.    Influenza A by PCR NEGATIVE NEGATIVE   Influenza B by PCR NEGATIVE NEGATIVE    Comment: (NOTE) The Xpert Xpress SARS-CoV-2/FLU/RSV assay is intended as an aid in  the diagnosis of influenza from Nasopharyngeal swab specimens and  should not be used as a sole basis for treatment. Nasal washings and  aspirates are unacceptable for Xpert Xpress SARS-CoV-2/FLU/RSV  testing. Fact Sheet for Patients: https://www.moore.com/https://www.fda.gov/media/142436/download Fact Sheet for Healthcare Providers: https://www.young.biz/https://www.fda.gov/media/142435/download This test is not yet approved or cleared by the [place] FDA and  has been authorized for detection and/or diagnosis of SARS-CoV-2 by  FDA under an Emergency Use Authorization (EUA). This EUA will remain  in effect (meaning this test can be used) for the duration of the  Covid-19 declaration under Section 564(b)(1) of the Act, 21  U.S.C. section 360bbb-3(b)(1), unless the authorization is  terminated or revoked. Performed at [facility], [address] [place], [place] [address]   Magnesium     Status: None   Collection Time: 10/25/19  2:47 AM  Result Value Ref Range   Magnesium 1.9 1.7 - 2.4 mg/dL    Comment: Performed at [facility], [address] [place], [place] [address]  Urine Drug Screen, Qualitative     Status: Abnormal   Collection Time: 10/25/19  5:48 AM  Result Value Ref Range   Tricyclic, Ur Screen POSITIVE (A) NONE DETECTED   Amphetamines, Ur Screen NONE DETECTED NONE DETECTED   MDMA (Ecstasy)Ur Screen NONE DETECTED NONE DETECTED   Cocaine Metabolite,Ur [place]ph[place]  NONE DETECTED NONE DETECTED   Opiate, Ur Screen NONE DETECTED NONE DETECTED   Phencyclidine (PCP) Ur S NONE DETECTED NONE DETECTED   Cannabinoid 50 Ng, Ur [place] NONE DETECTED NONE DETECTED   Barbiturates, Ur Screen NONE DETECTED NONE DETECTED   Benzodiazepine, Ur Scrn NONE DETECTED NONE DETECTED   Methadone Scn, Ur NONE DETECTED NONE DETECTED    Comment: (NOTE) Tricyclics + metabolites, urine    Cutoff 1000 ng/mL Amphetamines + metabolites, urine  Cutoff 1000 ng/mL MDMA (Ecstasy), urine              Cutoff 500 ng/mL Cocaine Metabolite, urine          Cutoff 300 ng/mL Opiate + metabolites, urine        Cutoff 300 ng/mL Phencyclidine (PCP), urine         Cutoff 25 ng/mL Cannabinoid, urine                 Cutoff 50 ng/mL Barbiturates + metabolites, urine  Cutoff 200 ng/mL Benzodiazepine, urine              Cutoff 200 ng/mL Methadone, urine                   Cutoff 300 ng/mL The urine drug screen provides only a preliminary, unconfirmed analytical test result and should not be used for non-medical purposes. Clinical consideration and professional judgment should be applied to any positive drug screen result due  to possible interfering substances. A more specific alternate chemical method must be used in order to obtain a confirmed analytical result. Gas chromatography / mass spectrometry (GC/MS) is the preferred confirmat ory method. Performed at [facility], [address] [place], [place] [address]     No current facility-administered medications for this encounter.   Current Outpatient Medications  Medication Sig Dispense Refill  . QUEtiapine (SEROQUEL) 300 MG tablet Take 1 tablet (300 mg total) by mouth at bedtime. 30 tablet 1    Musculoskeletal: Strength & Muscle Tone: within normal limits Gait & Station: normal Patient leans: N/A  Psychiatric Specialty Exam: Physical Exam  Nursing note and vitals reviewed. Constitutional: He appears well-developed and  well-nourished.  Cardiovascular: Normal rate.  Respiratory: Effort normal. No respiratory distress.  Musculoskeletal:        General: Normal range of motion.     Cervical back: Normal range of motion.  Neurological: He is alert.  Skin: Skin is warm and dry.  Psychiatric: His affect is labile. His speech is tangential. He is not agitated and not aggressive. Thought content is paranoid. Cognition and memory are impaired. He expresses impulsivity. He exhibits a depressed mood. He expresses suicidal ideation. He exhibits abnormal recent memory.    Review of Systems  Constitutional: Negative.   HENT: Negative.   Eyes: Negative.   Respiratory: Negative.   Cardiovascular: Negative.   Gastrointestinal: Negative.   Musculoskeletal: Negative.   Skin: Negative.   Neurological: Negative.   Psychiatric/Behavioral: Positive for behavioral problems, dysphoric mood, self-injury and suicidal ideas.    Blood pressure 130/85, pulse 87, temperature 98 F (36.7 C), temperature source Oral, resp. rate 19, height 5\' 9"  (1.753 m), weight 74.8 kg, SpO2 97 %.Body mass index is 24.37 kg/m.  General Appearance: Casual  Eye Contact:  Fair  Speech:  Slow  Volume:  Increased  Mood:  Dysphoric  Affect:  Inappropriate  Thought Process:  Disorganized  Orientation:  Full (Time, Place, and Person)  Thought Content:  Illogical, Rumination and Tangential  Suicidal Thoughts:  Yes.  without intent/plan  Homicidal Thoughts:  No  Memory:  Immediate;   Fair Recent;   Poor Remote;   Poor  Judgement:  Impaired  Insight:  Shallow  Psychomotor Activity:  Decreased  Concentration:  Concentration: Poor  Recall:  Poor  Fund of Knowledge:  Fair  Language:  Fair  Akathisia:  No  Handed:  Right  AIMS (if indicated):     Assets:  Desire for Improvement Housing Physical Health Resilience Social Support  ADL's:  Intact  Cognition:  Impaired,  Mild  Sleep:    Insomnia     Treatment Plan Summary: Daily contact  with patient to assess and evaluate symptoms and progress in treatment and Medication management  Disposition: Recommend psychiatric Inpatient admission when medically cleared. Supportive therapy provided about ongoing stressors.  , NP 10/25/2019 10:10 PM

## 2019-10-25 NOTE — ED Notes (Signed)
Update provided to poison control.

## 2019-10-25 NOTE — ED Notes (Signed)
Pt's bed was wet. Bedding was changed and pt was dressed in wine scrub bottoms.

## 2019-10-25 NOTE — ED Notes (Signed)
Pt incontinent of urine. Pt cleaned, pants and brief changed.

## 2019-10-25 NOTE — ED Notes (Signed)
HIPPA compliant update provided to his sister   She requests that the psychiatrist give her a call in the morning   Tom Smith  [phone]  She verbalizes concerns for his safety including this visit here is his third suicide attempt   Pt resting quietly  NAD observed  VSS

## 2019-10-25 NOTE — ED Notes (Signed)
Pt was given a cup of ice water without lid or straw. 

## 2019-10-25 NOTE — Progress Notes (Signed)
Tom Smith. is a 54 y.o. male with a history of psoriatic arthritis who comes in with concerns for overdose. The patient is not medically cleared; he is unable to be psychiatrically assessed.

## 2019-10-25 NOTE — ED Notes (Signed)
Pt was given ice water in a cup with no lid or straw.

## 2019-10-26 ENCOUNTER — Other Ambulatory Visit: Payer: Self-pay

## 2019-10-26 ENCOUNTER — Encounter: Payer: Self-pay | Admitting: Behavioral Health

## 2019-10-26 DIAGNOSIS — T43501A Poisoning by unspecified antipsychotics and neuroleptics, accidental (unintentional), initial encounter: Secondary | ICD-10-CM

## 2019-10-26 DIAGNOSIS — F319 Bipolar disorder, unspecified: Secondary | ICD-10-CM

## 2019-10-26 DIAGNOSIS — F332 Major depressive disorder, recurrent severe without psychotic features: Secondary | ICD-10-CM | POA: Diagnosis present

## 2019-10-26 MED ORDER — LITHIUM CARBONATE ER 450 MG PO TBCR
450.0000 mg | EXTENDED_RELEASE_TABLET | Freq: Every day | ORAL | Status: DC
  Administered 2019-10-26 – 2019-10-29 (×4): 450 mg via ORAL
  Filled 2019-10-26 (×4): qty 1

## 2019-10-26 MED ORDER — FLUOXETINE HCL 10 MG PO CAPS
10.0000 mg | ORAL_CAPSULE | Freq: Every day | ORAL | Status: DC
  Administered 2019-10-26 – 2019-10-30 (×5): 10 mg via ORAL
  Filled 2019-10-26 (×5): qty 1

## 2019-10-26 MED ORDER — MAGNESIUM HYDROXIDE 400 MG/5ML PO SUSP: 30.0000 mL | Freq: Every day | ORAL | Status: DC | PRN

## 2019-10-26 MED ORDER — ALUM & MAG HYDROXIDE-SIMETH 200-200-20 MG/5ML PO SUSP: 30.0000 mL | ORAL | Status: DC | PRN

## 2019-10-26 MED ORDER — ACETAMINOPHEN 325 MG PO TABS
650.0000 mg | ORAL_TABLET | Freq: Four times a day (QID) | ORAL | Status: DC | PRN
  Administered 2019-10-28 – 2019-10-29 (×2): 650 mg via ORAL
  Filled 2019-10-26 (×2): qty 2

## 2019-10-26 NOTE — [facility] Suicide Risk Assessment (Signed)
[facility] INPATIENT:  Family/Significant Other Suicide Prevention Education  Suicide Prevention Education:  Education Completed; Jujuan Dugo, sister [phone] has been identified by the patient as the family member/significant other with whom the patient will be residing, and identified as the person(s) who will aid the patient in the event of a mental health crisis (suicidal ideations/suicide attempt).  With written consent from the patient, the family member/significant other has been provided the following suicide prevention education, prior to the and/or following the discharge of the patient.  The suicide prevention education provided includes the following:  Suicide risk factors  Suicide prevention and interventions  National Suicide Hotline telephone number  [facility] assessment telephone number  [facility] Emergency Assistance 911  [facility] and/or Residential Mobile Crisis Unit telephone number  Request made of family/significant other to:  Remove weapons (e.g., guns, rifles, knives), all items previously/currently identified as safety concern.    Remove drugs/medications (over-the-counter, prescriptions, illicit drugs), all items previously/currently identified as a safety concern.  The family member/significant other verbalizes understanding of the suicide prevention education information provided.  The family member/significant other agrees to remove the items of safety concern listed above. Ms. Traynham reports the pt wasn't feeling better and had not made progress since his last hospitalization earlier this month. She states the pt communicated to her not feeling like his medication was working properly. Ms. Borum reports the pt has a history of depression and there is a family history of bipolar disorder and depression. She states the pt identifies the following stressors: separation from wife and daughter, financial hardship, unemployment and living with his  mother. Ms. Wlodarczyk says the pts wife and daughter live in [place] and they have been separated for about four years. She denies the pt having access to guns or weapons in the home and states she would like for him to be referred for outpatient therapy.  Zaharah Amir T Dexter Signor 10/26/2019, 3:07 PM

## 2019-10-26 NOTE — [facility] Suicide Risk Assessment (Signed)
[facility] INPATIENT:  Family/Significant Other Suicide Prevention Education  Suicide Prevention Education:  Contact Attempts: Luke Falero, sister [phone] has been identified by the patient as the family member/significant other with whom the patient will be residing, and identified as the person(s) who will aid the patient in the event of a mental health crisis.  With written consent from the patient, two attempts were made to provide suicide prevention education, prior to and/or following the patient's discharge.  We were unsuccessful in providing suicide prevention education.  A suicide education pamphlet was given to the patient to share with family/significant other.  Date and time of first attempt: 10/26/19 130pm CSW spoke with Ms. Pursel who reports she's not able to speak now but will call CSW at [phone]. Date and time of second attempt:  Lewi Drost T Khalilah Hoke 10/26/2019, 1:32 PM

## 2019-10-26 NOTE — Progress Notes (Signed)
Recreation Therapy Notes  Date: 10/26/2019  Time: 9:30 am  Location: Craft Room  Behavioral response: Appropriate   Intervention Topic: Relaxation   Discussion/Intervention:  Group content today was focused on relaxation. The group defined relaxation and identified healthy ways to relax. Individuals expressed how much time they spend relaxing. Patients expressed how much their life would be if they did not make time for themselves to relax. The group stated ways they could improve their relaxation techniques in the future.  Individuals participated in the intervention "Time to Relax" where they had a chance to experience different relaxation techniques.  Clinical Observations/Feedback:  Patient came to group and expressed that he likes to relax by going into nature. He stated relaxation is important for the health of your overall body. Participant identified financial stress as what normally prevents him from relaxing.  Individual was social with peers and staff while participant in the intervention during group.  Avery Klingbeil LRT/CTRS           Jara Feider 10/26/2019 12:10 PM

## 2019-10-26 NOTE — Progress Notes (Signed)
Recreation Therapy Notes  INPATIENT RECREATION THERAPY ASSESSMENT  Patient Details Name: Tom Smith. MRN: [id number] DOB: 1966/04/12 Today's Date: 10/26/2019       Information Obtained From: Patient  Able to Participate in Assessment/Interview: Yes  Patient Presentation: Responsive  Reason for Admission (Per Patient): Active Symptoms, Suicidal Ideation, Suicide Attempt  Patient Stressors:    Coping Skills:   Art, Other (Comment)(Walk)  Leisure Interests (2+):  Exercise - Walking, Nature - Hiking(Smoke art)  Frequency of Recreation/Participation: Monthly  Awareness of Community Resources:  Yes  Community Resources:  Park  Current Use:    If no, Barriers?:    Expressed Interest in [employer] Information:    [place] of Residence:  Film/video editor  Patient Main Form of Transportation: Set designer  Patient Strengths:  Realization  Patient Identified Areas of Improvement:  My thoughts  Patient Goal for Hospitalization:  Get over side effects of the overdose  Current SI (including self-harm):  No  Current HI:  No  Current AVH: Yes  Staff Intervention Plan: Group Attendance, Collaborate with Interdisciplinary Treatment Team  Consent to Intern Participation: N/A  Tom Smith 10/26/2019, 2:11 PM

## 2019-10-26 NOTE — [facility] Group Notes (Signed)
LCSW Group Therapy Note  10/26/2019 1:00 PM  Type of Therapy/Topic:  Group Therapy:  Balance in Life  Participation Level:  None  Description of Group:    This group will address the concept of balance and how it feels and looks when one is unbalanced. Patients will be encouraged to process areas in their lives that are out of balance and identify reasons for remaining unbalanced. Facilitators will guide patients in utilizing problem-solving interventions to address and correct the stressor making their life unbalanced. Understanding and applying boundaries will be explored and addressed for obtaining and maintaining a balanced life. Patients will be encouraged to explore ways to assertively make their unbalanced needs known to significant others in their lives, using other group members and facilitator for support and feedback.  Therapeutic Goals: 1. Patient will identify two or more emotions or situations they have that consume much of in their lives. 2. Patient will identify signs/triggers that life has become out of balance:  3. Patient will identify two ways to set boundaries in order to achieve balance in their lives:  4. Patient will demonstrate ability to communicate their needs through discussion and/or role plays  Summary of Patient Progress: Patient attended group, however, did not engage in group discussions.    Therapeutic Modalities:   Cognitive Behavioral Therapy Solution-Focused Therapy Assertiveness Training  Penni Homans MSW, LCSW 10/26/2019 12:49 PM

## 2019-10-26 NOTE — [facility] Suicide Risk Assessment (Signed)
[facility] Admission Suicide Risk Assessment   Nursing information obtained from:    Demographic factors:  Male, Low socioeconomic status, Unemployed Current Mental Status:  NA Loss Factors:  Financial problems / change in socioeconomic status Historical Factors:  Prior suicide attempts Risk Reduction Factors:  Positive social support, Living with another person, especially a relative  Total Time spent with patient: 1 hour Principal Problem: Bipolar depression ([place]) Diagnosis:  Principal Problem:   Bipolar depression ([place]) Active Problems:   MDD (major depressive disorder), recurrent episode, severe ([place])   Overdose of antipsychotic  Subjective Data: Patient seen chart reviewed.  54 year old man with a history of mood disorder.  Second suicide attempt within a month.  Patient continues to endorse multiple depression symptoms.  Denies acute wish to die at this time but admits that what he did was a suicide attempt prompting this admission.  Currently cooperative with treatment.  Continued Clinical Symptoms:  Alcohol Use Disorder Identification Test Final Score (AUDIT): 0 The "Alcohol Use Disorders Identification Test", Guidelines for Use in Primary Care, Second Edition.  [facility]). Score between 0-7:  no or low risk or alcohol related problems. Score between 8-15:  moderate risk of alcohol related problems. Score between 16-19:  high risk of alcohol related problems. Score 20 or above:  warrants further diagnostic evaluation for alcohol dependence and treatment.   CLINICAL FACTORS:   Bipolar Disorder:   Mixed State Depression:   Impulsivity   Musculoskeletal: Strength & Muscle Tone: within normal limits Gait & Station: normal Patient leans: N/A  Psychiatric Specialty Exam: Physical Exam  Nursing note and vitals reviewed. Constitutional: He appears well-developed and well-nourished.  HENT:  Head: Normocephalic and atraumatic.  Eyes: Pupils are equal, round,  and reactive to light. Conjunctivae are normal.  Cardiovascular: Regular rhythm and normal heart sounds.  Respiratory: Effort normal.  GI: Soft.  Musculoskeletal:        General: Normal range of motion.     Cervical back: Normal range of motion.  Neurological: He is alert.  Skin: Skin is warm and dry.  Psychiatric: His speech is normal. His mood appears anxious. He is not agitated and not aggressive. Thought content is not paranoid. He expresses impulsivity. He exhibits a depressed mood. He expresses suicidal ideation. He expresses no suicidal plans. He exhibits abnormal recent memory.    Review of Systems  Constitutional: Negative.   HENT: Negative.   Eyes: Negative.   Respiratory: Negative.   Cardiovascular: Negative.   Gastrointestinal: Negative.   Musculoskeletal: Negative.   Skin: Negative.   Neurological: Negative.   Psychiatric/Behavioral: Positive for dysphoric mood, self-injury and sleep disturbance.    Blood pressure (!) 138/92, pulse 76, temperature 98.4 F (36.9 C), temperature source Oral, resp. rate 12, height 5' 9"  (1.753 m), weight 70.8 kg, SpO2 98 %.Body mass index is 23.04 kg/m.  General Appearance: Casual  Eye Contact:  Fair  Speech:  Clear and Coherent  Volume:  Increased  Mood:  Anxious, Depressed and Dysphoric  Affect:  Congruent  Thought Process:  Disorganized  Orientation:  Full (Time, Place, and Person)  Thought Content:  Logical  Suicidal Thoughts:  Yes.  without intent/plan  Homicidal Thoughts:  No  Memory:  Immediate;   Fair Recent;   Fair Remote;   Fair  Judgement:  Fair  Insight:  Fair  Psychomotor Activity:  Decreased  Concentration:  Concentration: Fair  Recall:  [employer] of Knowledge:  Fair  Language:  Fair  Akathisia:  No  Handed:  Right  AIMS (if indicated):     Assets:  Desire for Improvement Housing  ADL's:  Intact  Cognition:  WNL  Sleep:  Number of Hours: 6      COGNITIVE FEATURES THAT CONTRIBUTE TO RISK:  Polarized  thinking and Thought constriction (tunnel vision)    SUICIDE RISK:   Moderate:  Frequent suicidal ideation with limited intensity, and duration, some specificity in terms of plans, no associated intent, good self-control, limited dysphoria/symptomatology, some risk factors present, and identifiable protective factors, including available and accessible social support.  PLAN OF CARE: Continue 15-minute checks.  Including appropriate therapy and recreation activities.  Patient met with treatment team today.  I will work on treatment for his symptoms with medication.  Reassess regularly prior to discharge planning  I certify that inpatient services furnished can reasonably be expected to improve the patient's condition.   Alethia Berthold, MD 10/26/2019, 11:35 AM

## 2019-10-26 NOTE — Progress Notes (Signed)
Patient admitted to unit alert and orient x4. Patient did had an episode of a visual hallucination, reports thinking he saw a cat in the doorway during assessment. Pt currently denies SI, but feels helpless and hopeless experiencing Anhedonia. Pt reports taking Seroquel and running into woods. Pt denies HI and Auditory hallucinations. Pt reports having financial difficulties, lives at home with mom and cant keep a job. Reports unable to focus and has zero motivation. Reports anxiety is a major issue and is caused mainly by his financial position. Pt reports not being compliant with medications. Admission assessment completed, fluids and nutrition offered. Skin and contraband assessment completed and witnessed by [place] RN, pt has abrasions to bilateral lower extremities healed and tattoos on torso. No other issues noted.  Patient receptive to care and remains safe on unit with q 15 min checks.

## 2019-10-26 NOTE — H&P (Signed)
Psychiatric Admission Assessment Adult  Patient Identification: Tom Smith. MRN:  [id number] Date of Evaluation:  10/26/2019 Chief Complaint:  MDD (major depressive disorder), recurrent episode, severe ([place]) [F33.2] Principal Diagnosis: Bipolar depression ([place]) Diagnosis:  Principal Problem:   Bipolar depression ([place]) Active Problems:   MDD (major depressive disorder), recurrent episode, severe ([place])   Overdose of antipsychotic  History of Present Illness: Patient seen and chart reviewed.  This is a 54 year old man with a history of depression and probably bipolar disorder who comes to the emergency room after taking an overdose of Seroquel.  Patient says that for the past week and in fact ever since his discharge he had been feeling on edge.  Never really felt much better and continued to feel anxious and depressed and hopeless.  Felt overwhelmed by multiple life stresses and a feeling that nothing would get better.  He went to a park on the night of admission and took a long what was left of his Seroquel and took all of it.  He estimates 21 pills and says that he was hoping to die.  Not entirely clear how he was found but he was agitated and somewhat delirious when he was brought to the hospital.  No evidence of any other drug use.  He denies that he has been drinking or using any recreational drugs.  Patient says that he was cooperative with his medication ever since discharge.  Sleep has been disturbed by frequent bad dreams.  Patient describes a lot of feelings of anxiety and worry with self recrimination and hopelessness.  He does not report any hallucinations or clear psychotic symptoms. Associated Signs/Symptoms: Depression Symptoms:  depressed mood, anhedonia, insomnia, feelings of worthlessness/guilt, suicidal attempt, anxiety, (Hypo) Manic Symptoms:  Impulsivity, Anxiety Symptoms:  Excessive Worry, Psychotic Symptoms:  No psychotic symptoms PTSD Symptoms: Negative Total Time  spent with patient: 1 hour  Past Psychiatric History: Patient has a past history of mood disorder.  He is relatively new to our area and we do not have many records but from what he describes he has probably had a manic episode as recently as this past summer.  It sounds like he mostly avoided treatment for a long time and in the past had struggled with substance abuse.  This is now his second hospitalization 1 after another both of them with suicide attempts.  Is the patient at risk to self? Yes.    Has the patient been a risk to self in the past 6 months? Yes.    Has the patient been a risk to self within the distant past? No.  Is the patient a risk to others? No.  Has the patient been a risk to others in the past 6 months? No.  Has the patient been a risk to others within the distant past? No.   Prior Inpatient Therapy:   Prior Outpatient Therapy:    Alcohol Screening: 1. How often do you have a drink containing alcohol?: Never 2. How many drinks containing alcohol do you have on a typical day when you are drinking?: 1 or 2 3. How often do you have six or more drinks on one occasion?: Never AUDIT-C Score: 0 4. How often during the last year have you found that you were not able to stop drinking once you had started?: Never 5. How often during the last year have you failed to do what was normally expected from you becasue of drinking?: Never 6. How often during the last year have  you needed a first drink in the morning to get yourself going after a heavy drinking session?: Never 7. How often during the last year have you had a feeling of guilt of remorse after drinking?: Never 8. How often during the last year have you been unable to remember what happened the night before because you had been drinking?: Never 9. Have you or someone else been injured as a result of your drinking?: No 10. Has a relative or friend or a doctor or another health worker been concerned about your drinking or  suggested you cut down?: No Alcohol Use Disorder Identification Test Final Score (AUDIT): 0 Alcohol Brief Interventions/Follow-up: AUDIT Score <7 follow-up not indicated Substance Abuse History in the last 12 months:  Yes.   Consequences of Substance Abuse: Negative Previous Psychotropic Medications: Yes  Psychological Evaluations: Yes  Past Medical History:  Past Medical History:  Diagnosis Date  . Psoriatic arthritis (HCC)    History reviewed. No pertinent surgical history. Family History: History reviewed. No pertinent family history. Family Psychiatric  History: There is reportedly a family history of bipolar disorder most notably in his sister Tobacco Screening: Have you used any form of tobacco in the last 30 days? (Cigarettes, Smokeless Tobacco, Cigars, and/or Pipes): Yes Tobacco use, Select all that apply: 5 or more cigarettes per day Are you interested in Tobacco Cessation Medications?: No, patient refused Counseled patient on smoking cessation including recognizing danger situations, developing coping skills and basic information about quitting provided: Refused/Declined practical counseling Social History:  Social History   Substance and Sexual Activity  Alcohol Use Not Currently   Comment: UTA     Social History   Substance and Sexual Activity  Drug Use Yes  . Types: Benzodiazepines   Comment: Overdosed on prescription pills 3/7    Additional Social History:                           Allergies:  No Known Allergies Lab Results:  Results for orders placed or performed during the hospital encounter of 10/25/19 (from the past 48 hour(s))  CBC with Differential     Status: Abnormal   Collection Time: 10/25/19  2:47 AM  Result Value Ref Range   WBC 16.2 (H) 4.0 - 10.5 K/uL   RBC 4.04 (L) 4.22 - 5.81 MIL/uL   Hemoglobin 13.2 13.0 - 17.0 g/dL   HCT 16.1 (L) 09.6 - 04.5 %   MCV 91.8 80.0 - 100.0 fL   MCH 32.7 26.0 - 34.0 pg   MCHC 35.6 30.0 - 36.0 g/dL    RDW 40.9 81.1 - 91.4 %   Platelets 280 150 - 400 K/uL   nRBC 0.0 0.0 - 0.2 %   Neutrophils Relative % 84 %   Neutro Abs 13.5 (H) 1.7 - 7.7 K/uL   Lymphocytes Relative 10 %   Lymphs Abs 1.6 0.7 - 4.0 K/uL   Monocytes Relative 6 %   Monocytes Absolute 1.0 0.1 - 1.0 K/uL   Eosinophils Relative 0 %   Eosinophils Absolute 0.0 0.0 - 0.5 K/uL   Basophils Relative 0 %   Basophils Absolute 0.0 0.0 - 0.1 K/uL   Immature Granulocytes 0 %   Abs Immature Granulocytes 0.06 0.00 - 0.07 K/uL    Comment: Performed at [facility], [address] [place], [place] [address]  Comprehensive metabolic panel     Status: Abnormal   Collection Time: 10/25/19  2:47 AM  Result  Value Ref Range   Sodium 139 135 - 145 mmol/L   Potassium 3.6 3.5 - 5.1 mmol/L   Chloride 103 98 - 111 mmol/L   CO2 26 22 - 32 mmol/L   Glucose, Bld 139 (H) 70 - 99 mg/dL    Comment: Glucose reference range applies only to samples taken after fasting for at least 8 hours.   BUN 22 (H) 6 - 20 mg/dL   Creatinine, Ser 0.27 (H) 0.61 - 1.24 mg/dL   Calcium 9.1 8.9 - 25.3 mg/dL   Total Protein 6.8 6.5 - 8.1 g/dL   Albumin 4.0 3.5 - 5.0 g/dL   AST 23 15 - 41 U/L   ALT 26 0 - 44 U/L   Alkaline Phosphatase 84 38 - 126 U/L   Total Bilirubin 1.0 0.3 - 1.2 mg/dL   GFR calc non Af Amer >60 >60 mL/min   GFR calc Af Amer >60 >60 mL/min   Anion gap 10 5 - 15    Comment: Performed at [facility], [address] [place], [place] [address]  Ethanol     Status: None   Collection Time: 10/25/19  2:47 AM  Result Value Ref Range   Alcohol, Ethyl (B) <10 <10 mg/dL    Comment: (NOTE) Lowest detectable limit for serum alcohol is 10 mg/dL. For medical purposes only. Performed at [facility], [address] [place], [place] [address]   Acetaminophen level     Status: Abnormal   Collection Time: 10/25/19  2:47 AM  Result Value Ref Range   Acetaminophen (Tylenol), Serum <10 (L) 10 - 30 ug/mL    Comment:  (NOTE) Therapeutic concentrations vary significantly. A range of 10-30 ug/mL  may be an effective concentration for many patients. However, some  are best treated at concentrations outside of this range. Acetaminophen concentrations >150 ug/mL at 4 hours after ingestion  and >50 ug/mL at 12 hours after ingestion are often associated with  toxic reactions. Performed at [facility], [address] [place], [place] [address]   Salicylate level     Status: Abnormal   Collection Time: 10/25/19  2:47 AM  Result Value Ref Range   Salicylate Lvl <7.0 (L) 7.0 - 30.0 mg/dL    Comment: Performed at [facility], [address], [place], [place] [address]  Respiratory Panel by RT PCR (Flu A&B, Covid) - Nasopharyngeal Swab     Status: None   Collection Time: 10/25/19  2:47 AM   Specimen: Nasopharyngeal Swab  Result Value Ref Range   SARS Coronavirus 2 by RT PCR NEGATIVE NEGATIVE    Comment: (NOTE) SARS-CoV-2 target nucleic acids are NOT DETECTED. The SARS-CoV-2 RNA is generally detectable in upper respiratoy specimens during the acute phase of infection. The lowest concentration of SARS-CoV-2 viral copies this assay can detect is 131 copies/mL. A negative result does not preclude SARS-Cov-2 infection and should not be used as the sole basis for treatment or other patient management decisions. A negative result may occur with  improper specimen collection/handling, submission of specimen other than nasopharyngeal swab, presence of viral mutation(s) within the areas targeted by this assay, and inadequate number of viral copies (<131 copies/mL). A negative result must be combined with clinical observations, patient history, and epidemiological information. The expected result is Negative. Fact Sheet for Patients:  https://www.moore.com/ Fact Sheet for Healthcare Providers:  https://www.young.biz/ This test is not yet ap proved or  cleared by the [place] and  has been authorized  for detection and/or diagnosis of SARS-CoV-2 by FDA under an Emergency Use Authorization (EUA). This EUA will remain  in effect (meaning this test can be used) for the duration of the COVID-19 declaration under Section 564(b)(1) of the Act, 21 U.S.C. section 360bbb-3(b)(1), unless the authorization is terminated or revoked sooner.    Influenza A by PCR NEGATIVE NEGATIVE   Influenza B by PCR NEGATIVE NEGATIVE    Comment: (NOTE) The Xpert Xpress SARS-CoV-2/FLU/RSV assay is intended as an aid in  the diagnosis of influenza from Nasopharyngeal swab specimens and  should not be used as a sole basis for treatment. Nasal washings and  aspirates are unacceptable for Xpert Xpress SARS-CoV-2/FLU/RSV  testing. Fact Sheet for Patients: https://www.moore.com/ Fact Sheet for Healthcare Providers: https://www.young.biz/ This test is not yet approved or cleared by the [place] FDA and  has been authorized for detection and/or diagnosis of SARS-CoV-2 by  FDA under an Emergency Use Authorization (EUA). This EUA will remain  in effect (meaning this test can be used) for the duration of the  Covid-19 declaration under Section 564(b)(1) of the Act, 21  U.S.C. section 360bbb-3(b)(1), unless the authorization is  terminated or revoked. Performed at [facility], [address] [place], [place] [address]   Magnesium     Status: None   Collection Time: 10/25/19  2:47 AM  Result Value Ref Range   Magnesium 1.9 1.7 - 2.4 mg/dL    Comment: Performed at [facility], [address] [place], [place] [address]  Urine Drug Screen, Qualitative     Status: Abnormal   Collection Time: 10/25/19  5:48 AM  Result Value Ref Range   Tricyclic, Ur Screen POSITIVE (A) NONE DETECTED   Amphetamines, Ur Screen NONE DETECTED NONE DETECTED   MDMA (Ecstasy)Ur Screen NONE DETECTED NONE DETECTED    Cocaine Metabolite,Ur [place] NONE DETECTED NONE DETECTED   Opiate, Ur Screen NONE DETECTED NONE DETECTED   Phencyclidine (PCP) Ur S NONE DETECTED NONE DETECTED   Cannabinoid 50 Ng, Ur [place] NONE DETECTED NONE DETECTED   Barbiturates, Ur Screen NONE DETECTED NONE DETECTED   Benzodiazepine, Ur Scrn NONE DETECTED NONE DETECTED   Methadone Scn, Ur NONE DETECTED NONE DETECTED    Comment: (NOTE) Tricyclics + metabolites, urine    Cutoff 1000 ng/mL Amphetamines + metabolites, urine  Cutoff 1000 ng/mL MDMA (Ecstasy), urine              Cutoff 500 ng/mL Cocaine Metabolite, urine          Cutoff 300 ng/mL Opiate + metabolites, urine        Cutoff 300 ng/mL Phencyclidine (PCP), urine         Cutoff 25 ng/mL Cannabinoid, urine                 Cutoff 50 ng/mL Barbiturates + metabolites, urine  Cutoff 200 ng/mL Benzodiazepine, urine              Cutoff 200 ng/mL Methadone, urine                   Cutoff 300 ng/mL The urine drug screen provides only a preliminary, unconfirmed analytical test result and should not be used for non-medical purposes. Clinical consideration and professional judgment should be applied to any positive drug screen result due to possible interfering substances. A more specific alternate chemical method must be used in order to obtain a confirmed analytical result. Gas chromatography / mass spectrometry (GC/MS) is the preferred [employer]  ory method. Performed at [facility], [address] [place], [place] [address]     Blood Alcohol level:  Lab Results  Component Value Date   [facility] 10[facility]   ETH <10 10/08/2019    Metabolic Disorder Labs:  No results found for: HGBA1C, MPG No results found for: PROLACTIN No results found for: CHOL, TRIG, HDL, CHOLHDL, VLDL, LDLCALC  Current Medications: Current Facility-Administered Medications  Medication Dose Route Frequency Provider Last Rate Last Admin  . acetaminophen (TYLENOL) tablet 650 mg  650 mg Oral Q6H PRN  Gillermo Murdochhompson, Jacqueline, NP      . alum & mag hydroxide-simeth (MAALOX/MYLANTA) 200-200-20 MG/5ML suspension 30 mL  30 mL Oral Q4H PRN Gillermo Murdochhompson, Jacqueline, NP      . magnesium hydroxide (MILK OF MAGNESIA) suspension 30 mL  30 mL Oral Daily PRN Gillermo Murdochhompson, Jacqueline, NP       PTA Medications: Medications Prior to Admission  Medication Sig Dispense Refill Last Dose  . QUEtiapine (SEROQUEL) 300 MG tablet Take 1 tablet (300 mg total) by mouth at bedtime. 30 tablet 1 10/25/2019 at Unknown time    Musculoskeletal: Strength & Muscle Tone: within normal limits Gait & Station: normal Patient leans: N/A  Psychiatric Specialty Exam: Physical Exam  Nursing note and vitals reviewed. Constitutional: He appears well-developed and well-nourished.  HENT:  Head: Normocephalic and atraumatic.  Eyes: Pupils are equal, round, and reactive to light. Conjunctivae are normal.  Cardiovascular: Regular rhythm and normal heart sounds.  Respiratory: Effort normal.  GI: Soft.  Musculoskeletal:        General: Normal range of motion.     Cervical back: Normal range of motion.  Neurological: He is alert.  Skin: Skin is warm and dry.  Psychiatric: His speech is normal and behavior is normal. His affect is not blunt. His speech is not delayed. He is not slowed. He expresses impulsivity. He exhibits a depressed mood. He expresses suicidal ideation. He expresses no suicidal plans. He exhibits abnormal recent memory.    Review of Systems  Constitutional: Negative.   HENT: Negative.   Eyes: Negative.   Respiratory: Negative.   Cardiovascular: Negative.   Gastrointestinal: Negative.   Musculoskeletal: Negative.   Skin: Negative.   Neurological: Negative.   Psychiatric/Behavioral: Positive for dysphoric mood, self-injury and sleep disturbance.    Blood pressure (!) 138/92, pulse 76, temperature 98.4 F (36.9 C), temperature source Oral, resp. rate 12, height 5\' 9"  (1.753 m), weight 70.8 kg, SpO2 98 %.Body mass  index is 23.04 kg/m.  General Appearance: Casual  Eye Contact:  Fair  Speech:  Clear and Coherent  Volume:  Increased  Mood:  Anxious, Depressed and Dysphoric  Affect:  Congruent  Thought Process:  Coherent  Orientation:  Full (Time, Place, and Person)  Thought Content:  Rumination and Tangential  Suicidal Thoughts:  Yes.  without intent/plan  Homicidal Thoughts:  No  Memory:  Immediate;   Fair Recent;   Fair Remote;   Fair  Judgement:  Impaired  Insight:  Shallow  Psychomotor Activity:  Restlessness  Concentration:  Concentration: Fair  Recall:  [employer]Fair  Fund of Knowledge:  Fair  Language:  Fair  Akathisia:  No  Handed:  Right  AIMS (if indicated):     Assets:  Desire for Improvement Housing Resilience  ADL's:  Intact  Cognition:  WNL  Sleep:  Number of Hours: 6    Treatment Plan Summary: Daily contact with patient to assess and evaluate symptoms and progress in treatment, Medication  management and Plan 54 year old man who continues to have multiple symptoms of major depression.  2 serious suicide attempts in a row.  Patient is not reporting psychotic symptoms.  He is anxious and talkative but not hyperactive in his behavior.  I reviewed the recent attempt at treating his depression with Seroquel.  Given that he is back in the hospital I suggest we make a medicine change.  Discontinue Seroquel start fluoxetine and lithium combination.  Explained that the lithium helps with treatment of depression and can suppress suicidal thoughts.  Continue engagement on the unit and appropriate therapeutic and recreational activities.  Ongoing assessment before considering discharge planning.  Observation Level/Precautions:  15 minute checks  Laboratory:  Chemistry Profile  Psychotherapy:    Medications:    Consultations:    Discharge Concerns:    Estimated LOS:  Other:     Physician Treatment Plan for Primary Diagnosis: Bipolar depression (HCC) Long Term Goal(s): Improvement in  symptoms so as ready for discharge  Short Term Goals: Ability to disclose and discuss suicidal ideas and Ability to demonstrate self-control will improve  Physician Treatment Plan for Secondary Diagnosis: Principal Problem:   Bipolar depression (HCC) Active Problems:   MDD (major depressive disorder), recurrent episode, severe (HCC)   Overdose of antipsychotic  Long Term Goal(s): Improvement in symptoms so as ready for discharge  Short Term Goals: Compliance with prescribed medications will improve  I certify that inpatient services furnished can reasonably be expected to improve the patient's condition.    Mordecai Rasmussen, MD 3/25/202111:38 AM

## 2019-10-26 NOTE — Progress Notes (Signed)
Patient has been observed out in the milieu, and attending unit groups throughout the day, without any issues. Patient remains safe on the unit and will continue to be monitored.

## 2019-10-26 NOTE — Tx Team (Signed)
Initial Treatment Plan 10/26/2019 12:29 AM Rhae Lerner. TDH:[id number]    PATIENT STRESSORS: Financial difficulties Medication change or noncompliance Occupational concerns Substance abuse   PATIENT STRENGTHS: Average or above average intelligence Communication skills General fund of knowledge   PATIENT IDENTIFIED PROBLEMS: Bipolar Depression  Depression  Suicidal Ideation                 DISCHARGE CRITERIA:  Improved stabilization in mood, thinking, and/or behavior  PRELIMINARY DISCHARGE PLAN: Outpatient therapy  PATIENT/FAMILY INVOLVEMENT: This treatment plan has been presented to and reviewed with the patient, Tom Smith., and/or family member, .  The patient and family have been given the opportunity to ask questions and make suggestions.  Shelia Media, RN 10/26/2019, 12:29 AM
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
## 2019-10-26 NOTE — Plan of Care (Signed)
D- Patient alert and oriented. Patient presents in a pleasant mood on assessment stating that he had dreams last night that were "all over the place". Patient had no complaints to voice to this writer, however, he did state that he was "just groggy". Patient also stated that he is experiencing "all the symptoms of the overdose, I'm seeing things out of my peripheral. I can tell it's from the medication". When this writer asked patient about depression/anxiety, he stated "more so shame and disappointment". Patient denies SI, HI, AVH, and pain at this time. Patient had no stated goals for today.  A- Scheduled medications administered to patient, per MD orders. Support and encouragement provided.  Routine safety checks conducted every 15 minutes.  Patient informed to notify staff with problems or concerns.  R- No adverse drug reactions noted. Patient contracts for safety at this time. Patient compliant with medications and treatment plan. Patient receptive, calm, and cooperative. Patient interacts well with others on the unit.  Patient remains safe at this time.  Problem: Education: Goal: Knowledge of [facility] Education information/materials will improve Outcome: Progressing Goal: Emotional status will improve Outcome: Progressing Goal: Mental status will improve Outcome: Progressing Goal: Verbalization of understanding the information provided will improve Outcome: Progressing   Problem: Activity: Goal: Interest or engagement in activities will improve Outcome: Progressing Goal: Sleeping patterns will improve Outcome: Progressing   Problem: Coping: Goal: Ability to verbalize frustrations and anger appropriately will improve Outcome: Progressing Goal: Ability to demonstrate self-control will improve Outcome: Progressing   Problem: Health Behavior/Discharge Planning: Goal: Identification of resources available to assist in meeting health care needs will improve Outcome:  Progressing Goal: Compliance with treatment plan for underlying cause of condition will improve Outcome: Progressing   Problem: Physical Regulation: Goal: Ability to maintain clinical measurements within normal limits will improve Outcome: Progressing   Problem: Safety: Goal: Periods of time without injury will increase Outcome: Progressing   Problem: Education: Goal: Ability to make informed decisions regarding treatment will improve Outcome: Progressing   Problem: Coping: Goal: Coping ability will improve Outcome: Progressing   Problem: Medication: Goal: Compliance with prescribed medication regimen will improve Outcome: Progressing   Problem: Education: Goal: Ability to state activities that reduce stress will improve Outcome: Progressing   Problem: Self-Concept: Goal: Level of anxiety will decrease Outcome: Progressing Goal: Ability to modify response to factors that promote anxiety will improve Outcome: Progressing

## 2019-10-26 NOTE — Tx Team (Addendum)
Interdisciplinary Treatment and Diagnostic Plan Update  10/26/2019 Time of Session: 9am Tom Smith. MRN: [id number]  Principal Diagnosis: <principal problem not specified>  Secondary Diagnoses: Active Problems:   MDD (major depressive disorder), recurrent episode, severe (HCC)   Current Medications:  Current Facility-Administered Medications  Medication Dose Route Frequency Provider Last Rate Last Admin  . acetaminophen (TYLENOL) tablet 650 mg  650 mg Oral Q6H PRN Caroline Sauger, NP      . alum & mag hydroxide-simeth (MAALOX/MYLANTA) 200-200-20 MG/5ML suspension 30 mL  30 mL Oral Q4H PRN Caroline Sauger, NP      . magnesium hydroxide (MILK OF MAGNESIA) suspension 30 mL  30 mL Oral Daily PRN Caroline Sauger, NP       PTA Medications: Medications Prior to Admission  Medication Sig Dispense Refill Last Dose  . QUEtiapine (SEROQUEL) 300 MG tablet Take 1 tablet (300 mg total) by mouth at bedtime. 30 tablet 1 10/25/2019 at Unknown time    Patient Stressors: Financial difficulties Medication change or noncompliance Occupational concerns Substance abuse  Patient Strengths: Average or above average intelligence Communication skills General fund of knowledge  Treatment Modalities: Medication Management, Group therapy, Case management,  1 to 1 session with clinician, Psychoeducation, Recreational therapy.   Physician Treatment Plan for Primary Diagnosis: <principal problem not specified> Long Term Goal(s):     Short Term Goals:    Medication Management: Evaluate patient's response, side effects, and tolerance of medication regimen.  Therapeutic Interventions: 1 to 1 sessions, Unit Group sessions and Medication administration.  Evaluation of Outcomes: Not Met  Physician Treatment Plan for Secondary Diagnosis: Active Problems:   MDD (major depressive disorder), recurrent episode, severe ([place])  Long Term Goal(s):     Short Term Goals:       Medication  Management: Evaluate patient's response, side effects, and tolerance of medication regimen.  Therapeutic Interventions: 1 to 1 sessions, Unit Group sessions and Medication administration.  Evaluation of Outcomes: Not Met   RN Treatment Plan for Primary Diagnosis: <principal problem not specified> Long Term Goal(s): Knowledge of disease and therapeutic regimen to maintain health will improve  Short Term Goals: Ability to demonstrate self-control, Ability to participate in decision making will improve, Ability to verbalize feelings will improve, Ability to disclose and discuss suicidal ideas, Ability to identify and develop effective coping behaviors will improve and Compliance with prescribed medications will improve  Medication Management: RN will administer medications as ordered by provider, will assess and evaluate patient's response and provide education to patient for prescribed medication. RN will report any adverse and/or side effects to prescribing provider.  Therapeutic Interventions: 1 on 1 counseling sessions, Psychoeducation, Medication administration, Evaluate responses to treatment, Monitor vital signs and CBGs as ordered, Perform/monitor CIWA, COWS, AIMS and Fall Risk screenings as ordered, Perform wound care treatments as ordered.  Evaluation of Outcomes: Not Met   LCSW Treatment Plan for Primary Diagnosis: <principal problem not specified> Long Term Goal(s): Safe transition to appropriate next level of care at discharge, Engage patient in therapeutic group addressing interpersonal concerns.  Short Term Goals: Engage patient in aftercare planning with referrals and resources  Therapeutic Interventions: Assess for all discharge needs, 1 to 1 time with Social worker, Explore available resources and support systems, Assess for adequacy in community support network, Educate family and significant other(s) on suicide prevention, Complete Psychosocial Assessment, Interpersonal group  therapy.  Evaluation of Outcomes: Not Met   Progress in Treatment: Attending groups: No. Participating in groups: No. Taking medication as prescribed: Yes.  Toleration medication: Yes. Family/Significant other contact made: No, will contact:  Edwena Bunde, sister Patient understands diagnosis: Yes. Discussing patient identified problems/goals with staff: Yes. Medical problems stabilized or resolved: No. Denies suicidal/homicidal ideation: No. Issues/concerns per patient self-inventory: No. Other: NA  New problem(s) identified: No, Describe:  None reported  New Short Term/Long Term Goal(s):Attend outpatient treatment, take medication as prescribed, develop and implement healthy coping methods  Patient Goals:  "Im not sure at this point"  Discharge Plan or Barriers: Pt will return home and follow up with outpatient treatment  Reason for Continuation of Hospitalization: Depression Medication stabilization Suicidal ideation  Estimated Length of Stay:1-7 days  Recreational Therapy: Patient Stressors: N/A Patient Goal: Patient will engage in groups without prompting or encouragement from LRT x3 group sessions within 5 recreation therapy group sessions  Attendees: Patient:Tom Smith 10/26/2019 10:19 AM  Physician: Alethia Berthold 10/26/2019 10:19 AM  Nursing: Nat Math Ravenell 10/26/2019 10:19 AM  RN Care Manager: 10/26/2019 10:19 AM  Social Worker: Anise Salvo 10/26/2019 10:19 AM  Recreational Therapist: Roanna Epley 10/26/2019 10:19 AM  Other:  10/26/2019 10:19 AM  Other:  10/26/2019 10:19 AM  Other: 10/26/2019 10:19 AM    Scribe for Treatment Team: Yvette Rack, LCSW 10/26/2019 10:19 AM

## 2019-10-27 MED ORDER — HYDROXYZINE HCL 25 MG PO TABS
25.0000 mg | ORAL_TABLET | Freq: Once | ORAL | Status: AC
  Administered 2019-10-27: 25 mg via ORAL
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
## 2019-10-27 NOTE — Plan of Care (Signed)
  Problem: Education: Goal: Ability to state activities that reduce stress will improve Outcome: Progressing   Problem: Medication: Goal: Compliance with prescribed medication regimen will improve Outcome: Progressing   Problem: Education: Goal: Ability to make informed decisions regarding treatment will improve Outcome: Progressing   Problem: Education: Goal: Emotional status will improve Outcome: Progressing  Patient appears less anxious, interacting appropriately with peers on the unit, he is compliant with medication regimen and can make informed decisions, n aggressive behavior.

## 2019-10-27 NOTE — Progress Notes (Signed)
[facility] MD Progress Note  10/27/2019 2:32 PM Tom Smith.  MRN:  [id number] Subjective: Follow-up for this patient with what appears to be probably bipolar type depression.  Second suicide attempt in a month.  Patient seen and chart reviewed.  Patient says that he had an anxiety attack around 4:00 this morning.  He woke up panicking about his car and his cell phone neither of which he remembers where he left them.  Nursing confirms that this happened and that he calm down with Vistaril.  Since then he has mostly been in his room.  Somewhat disheveled but awake alert and willing to engage in conversation.  He describes himself as having a lot of worries but says he is not actively suicidal and denies psychotic symptoms.  No new physical complaints.  Blood pressure slightly elevated today. Principal Problem: Bipolar depression (HCC) Diagnosis: Principal Problem:   Bipolar depression (HCC) Active Problems:   MDD (major depressive disorder), recurrent episode, severe (HCC)   Overdose of antipsychotic  Total Time spent with patient: 30 minutes  Past Psychiatric History: Patient has a history of a prior admission just a couple weeks ago after another suicide attempt and also gives a history consistent with mania this past summer  Past Medical History:  Past Medical History:  Diagnosis Date  . Psoriatic arthritis (HCC)    History reviewed. No pertinent surgical history. Family History: History reviewed. No pertinent family history. Family Psychiatric  History: See previous.  Apparently members of the family have bipolar Social History:  Social History   Substance and Sexual Activity  Alcohol Use Not Currently   Comment: UTA     Social History   Substance and Sexual Activity  Drug Use Yes  . Types: Benzodiazepines   Comment: Overdosed on prescription pills 3/7    Social History   Socioeconomic History  . Marital status: Divorced    Spouse name: Not on file  . Number of children: Not  on file  . Years of education: Not on file  . Highest education level: Not on file  Occupational History  . Not on file  Tobacco Use  . Smoking status: Current Every Day Smoker    Packs/day: 1.00    Types: Cigarettes  . Smokeless tobacco: Never Used  Substance and Sexual Activity  . Alcohol use: Not Currently    Comment: UTA  . Drug use: Yes    Types: Benzodiazepines    Comment: Overdosed on prescription pills 3/7  . Sexual activity: Not on file  Other Topics Concern  . Not on file  Social History Narrative  . Not on file   Social Determinants of Health   Financial Resource Strain:   . Difficulty of Paying Living Expenses:   Food Insecurity:   . Worried About Programme researcher, broadcasting/film/video in the Last Year:   . Barista in the Last Year:   Transportation Needs:   . Freight forwarder (Medical):   Marland Kitchen Lack of Transportation (Non-Medical):   Physical Activity:   . Days of Exercise per Week:   . Minutes of Exercise per Session:   Stress:   . Feeling of Stress :   Social Connections:   . Frequency of Communication with Friends and Family:   . Frequency of Social Gatherings with Friends and Family:   . Attends Religious Services:   . Active Member of Clubs or Organizations:   . Attends Banker Meetings:   Marland Kitchen Marital Status:  Additional Social History:                         Sleep: Fair  Appetite:  Fair  Current Medications: Current Facility-Administered Medications  Medication Dose Route Frequency Provider Last Rate Last Admin  . acetaminophen (TYLENOL) tablet 650 mg  650 mg Oral Q6H PRN Caroline Sauger, NP      . alum & mag hydroxide-simeth (MAALOX/MYLANTA) 200-200-20 MG/5ML suspension 30 mL  30 mL Oral Q4H PRN Caroline Sauger, NP      . FLUoxetine (PROZAC) capsule 10 mg  10 mg Oral Daily Subrena Devereux, Madie Reno, MD   10 mg at 10/27/19 0805  . lithium carbonate (ESKALITH) CR tablet 450 mg  450 mg Oral QHS Cagney Steenson, Madie Reno, MD   450 mg at  10/26/19 2123  . magnesium hydroxide (MILK OF MAGNESIA) suspension 30 mL  30 mL Oral Daily PRN Caroline Sauger, NP        Lab Results: No results found for this or any previous visit (from the past 48 hour(s)).  Blood Alcohol level:  Lab Results  Component Value Date   ETH <10 10/25/2019   ETH <10 40/98/1191    Metabolic Disorder Labs: No results found for: HGBA1C, MPG No results found for: PROLACTIN No results found for: CHOL, TRIG, HDL, CHOLHDL, VLDL, LDLCALC  Physical Findings: AIMS: Facial and Oral Movements Muscles of Facial Expression: None, normal Lips and Perioral Area: None, normal Jaw: None, normal Tongue: None, normal,Extremity Movements Upper (arms, wrists, hands, fingers): None, normal Lower (legs, knees, ankles, toes): None, normal, Trunk Movements Neck, shoulders, hips: None, normal, Overall Severity Severity of abnormal movements (highest score from questions above): None, normal Incapacitation due to abnormal movements: None, normal Patient's awareness of abnormal movements (rate only patient's report): No Awareness, Dental Status Current problems with teeth and/or dentures?: No Does patient usually wear dentures?: No  CIWA:    COWS:  COWS Total Score: 3  Musculoskeletal: Strength & Muscle Tone: within normal limits Gait & Station: normal Patient leans: N/A  Psychiatric Specialty Exam: Physical Exam  Nursing note and vitals reviewed. Constitutional: He appears well-developed and well-nourished.  HENT:  Head: Normocephalic and atraumatic.  Eyes: Pupils are equal, round, and reactive to light. Conjunctivae are normal.  Cardiovascular: Regular rhythm and normal heart sounds.  Respiratory: Effort normal. No respiratory distress.  GI: Soft.  Musculoskeletal:        General: Normal range of motion.     Cervical back: Normal range of motion.  Neurological: He is alert.  Skin: Skin is warm and dry.  Psychiatric: Judgment normal. His speech is  delayed. He is slowed. He exhibits a depressed mood. He expresses no homicidal and no suicidal ideation. He exhibits abnormal recent memory.    Review of Systems  Constitutional: Negative.   HENT: Negative.   Eyes: Negative.   Respiratory: Negative.   Cardiovascular: Negative.   Gastrointestinal: Negative.   Musculoskeletal: Negative.   Skin: Negative.   Neurological: Negative.   Psychiatric/Behavioral: Positive for dysphoric mood.    Blood pressure (!) 133/91, pulse 86, temperature 98.1 F (36.7 C), temperature source Oral, resp. rate 17, height 5\' 9"  (1.753 m), weight 70.8 kg, SpO2 98 %.Body mass index is 23.04 kg/m.  General Appearance: Casual  Eye Contact:  Fair  Speech:  Clear and Coherent  Volume:  Normal  Mood:  Dysphoric  Affect:  Depressed  Thought Process:  Coherent  Orientation:  Full (Time, Place, and Person)  Thought Content:  Logical  Suicidal Thoughts:  No  Homicidal Thoughts:  No  Memory:  Immediate;   Fair Recent;   Poor Remote;   Fair  Judgement:  Fair  Insight:  Fair  Psychomotor Activity:  Decreased  Concentration:  Concentration: Fair  Recall:  [employer] of Knowledge:  Fair  Language:  Fair  Akathisia:  No  Handed:  Right  AIMS (if indicated):     Assets:  Communication Skills Desire for Improvement Housing Resilience Social Support  ADL's:  Impaired  Cognition:  WNL  Sleep:  Number of Hours: 6.75     Treatment Plan Summary: Daily contact with patient to assess and evaluate symptoms and progress in treatment, Medication management and Plan Patient tolerating low doses of new medication.  Continue lithium and Prozac at current doses.  Lithium level will be ordered for Monday morning.  Spent time doing cognitive therapy with psychoeducation.  Encouraged patient to keep getting up out of bed participating in groups.  Let staff know if his anxiety is getting worse.  I am not adding any benzodiazepines but will leave the Vistaril in place.  Mordecai Rasmussen, MD 10/27/2019, 2:32 PM

## 2019-10-27 NOTE — Progress Notes (Signed)
Recreation Therapy Notes  Date: 10/27/2019  Time: 9:30 am  Location: Craft Room  Behavioral response: Appropriate   Intervention Topic: Happiness    Discussion/Intervention:  Group content today was focused on Happiness. The group defined happiness and described where happiness comes from. Individuals identified what makes them happy and how they go about making others happy. Patients expressed things that stop them from being happy and ways they can improve their happiness. The group stated reasons why it is important to be happy. The group participated in the intervention "My Happiness", where they had a chance to identify and express things that make them happy. Clinical Observations/Feedback:  Patient came to group and was not engaged in the discussion.  Individual was social with peers and staff while participant in the intervention during group.  [name] LRT/CTRS         Tom Smith 10/27/2019 11:19 AM

## 2019-10-27 NOTE — Plan of Care (Deleted)
  Problem: Education: Goal: Emotional status will improve Outcome: Progressing Goal: Verbalization of understanding the information provided will improve Outcome: Progressing   Problem: Coping: Goal: Ability to verbalize frustrations and anger appropriately will improve Outcome: Progressing   Problem: Education: Goal: Emotional status will improve Outcome: Progressing

## 2019-10-27 NOTE — [facility] Group Notes (Signed)
Feelings Around Relapse 10/27/2019 1PM  Type of Therapy and Topic:  Group Therapy:  Feelings around Relapse and Recovery  Participation Level:  Did Not Attend   Description of Group:    Patients in this group will discuss emotions they experience before and after a relapse. They will process how experiencing these feelings, or avoidance of experiencing them, relates to having a relapse. Facilitator will guide patients to explore emotions they have related to recovery. Patients will be encouraged to process which emotions are more powerful. They will be guided to discuss the emotional reaction significant others in their lives may have to patients' relapse or recovery. Patients will be assisted in exploring ways to respond to the emotions of others without this contributing to a relapse.  Therapeutic Goals: 1. Patient will identify two or more emotions that lead to a relapse for them 2. Patient will identify two emotions that result when they relapse 3. Patient will identify two emotions related to recovery 4. Patient will demonstrate ability to communicate their needs through discussion and/or role plays   Summary of Patient Progress:     Therapeutic Modalities:   Cognitive Behavioral Therapy Solution-Focused Therapy Assertiveness Training Relapse Prevention Therapy   Suzan Slick, LCSW 10/27/2019 1:58 PM

## 2019-10-27 NOTE — Plan of Care (Signed)
  Problem: Education: Goal: Knowledge of [facility] Education information/materials will improve Outcome: Progressing Goal: Emotional status will improve Outcome: Progressing Goal: Mental status will improve Outcome: Progressing Goal: Verbalization of understanding the information provided will improve Outcome: Progressing   Problem: Activity: Goal: Interest or engagement in activities will improve Outcome: Progressing Goal: Sleeping patterns will improve Outcome: Progressing   Problem: Coping: Goal: Ability to verbalize frustrations and anger appropriately will improve Outcome: Progressing Goal: Ability to demonstrate self-control will improve Outcome: Progressing   Problem: Health Behavior/Discharge Planning: Goal: Identification of resources available to assist in meeting health care needs will improve Outcome: Progressing Goal: Compliance with treatment plan for underlying cause of condition will improve Outcome: Progressing   Problem: Physical Regulation: Goal: Ability to maintain clinical measurements within normal limits will improve Outcome: Progressing   Problem: Safety: Goal: Periods of time without injury will increase Outcome: Progressing   Problem: Education: Goal: Ability to make informed decisions regarding treatment will improve Outcome: Progressing   Problem: Coping: Goal: Coping ability will improve Outcome: Progressing   Problem: Medication: Goal: Compliance with prescribed medication regimen will improve Outcome: Progressing   Problem: Education: Goal: Ability to state activities that reduce stress will improve Outcome: Progressing   Problem: Self-Concept: Goal: Level of anxiety will decrease Outcome: Progressing Goal: Ability to modify response to factors that promote anxiety will improve Outcome: Progressing

## 2019-10-27 NOTE — Progress Notes (Signed)
Patient alert and oriented x 4, thoughts are organized and coherent,  affect is flat but he brightens upon approach, he   currently denies SI/HI/AVH and contracts for safety. Patient appears less anxious seen in milieu interacting appropriately with peers and staff,complaint with medication regimen, no distress noted. Patient was offered emotional support, he was complaint with evening medication regimen,15 minutes safety checks maintained will continue to monitor.

## 2019-10-27 NOTE — Plan of Care (Signed)
Patient is pacing in the unit.States " I am in the real life situation.I have to handle it.I don't know where is my car or my phone.I have been through a lot of emotional disturbances." Patient sated that he is learning how to use coping skills here.Patient looks tensed.Pleasant and cooperative on approach.Denies SI,HI and AVH.Minimal interactions with peers.Compliant with medications.Appetite and energy level good.Attended groups.Support and encouragement given.

## 2019-10-28 MED ORDER — OLANZAPINE 5 MG PO TABS
5.0000 mg | ORAL_TABLET | Freq: Every day | ORAL | Status: DC
  Administered 2019-10-28 – 2019-10-29 (×2): 5 mg via ORAL
  Filled 2019-10-28 (×2): qty 1

## 2019-10-28 NOTE — [facility] Group Notes (Signed)
LCSW Group Therapy Note  10/28/2019 1:00pm  Type of Therapy and Topic:  Group Therapy:  Cognitive Distortions  Participation Level:  Did Not Attend   Description of Group:    Patients in this group will be introduced to the topic of cognitive distortions.  Patients will identify and describe cognitive distortions, describe the feelings these distortions create for them.  Patients will identify one or more situations in their personal life where they have cognitively distorted thinking and will verbalize challenging this cognitive distortion through positive thinking skills.  Patients will practice the skill of using positive affirmations to challenge cognitive distortions using affirmation cards.    Therapeutic Goals:  1. Patient will identify two or more cognitive distortions they have used 2. Patient will identify one or more emotions that stem from use of a cognitive distortion 3. Patient will demonstrate use of a positive affirmation to counter a cognitive distortion through discussion and/or role play. 4. Patient will describe one way cognitive distortions can be detrimental to wellness   Summary of Patient Progress:  X   Therapeutic Modalities:   Cognitive Behavioral Therapy Motivational Interviewing   Teresita Madura, MSW, [facility] Clinical Social Worker  10/28/2019 2:34 PM

## 2019-10-28 NOTE — Plan of Care (Signed)
  Problem: Education: Goal: Knowledge of [facility] Education information/materials will improve Outcome: Progressing Goal: Emotional status will improve Outcome: Progressing Goal: Mental status will improve Outcome: Progressing Goal: Verbalization of understanding the information provided will improve Outcome: Progressing   Problem: Activity: Goal: Interest or engagement in activities will improve Outcome: Progressing   

## 2019-10-28 NOTE — Progress Notes (Signed)
[facility] MD Progress Note  10/28/2019 8:58 AM Ilda Basset.  MRN:  [id number] Subjective:    Mr. Stehlik is 39 he has been diagnosed with a bipolar type depression however he describes to me that he was "manic all summer" and this apparently was his second suicide attempt in a months time He states now he is anxious and depressed over "real life" he does not know where his car is, where his phone is, and "cannot do much about it here" but he is a bit pressured in speech and a bit giddy and aloof so I think he is still having some mixed symptomatology.  He is already on lithium and low-dose SRI therapy.  He understood the basic risk-benefit side effects when discussed.  Denies wanting to harm himself at this point in time  Principal Problem: Bipolar depression ([place]) Diagnosis: Principal Problem:   Bipolar depression ([place]) Active Problems:   MDD (major depressive disorder), recurrent episode, severe ([place])   Overdose of antipsychotic  Total Time spent with patient: 20 minutes  Past Psychiatric History: "They told me I was bipolar this summer"  Past Medical History:  Past Medical History:  Diagnosis Date  . Psoriatic arthritis ([place])    History reviewed. No pertinent surgical history. Family History: History reviewed. No pertinent family history. Family Psychiatric  History: Shares no new data today Social History:  Social History   Substance and Sexual Activity  Alcohol Use Not Currently   Comment: UTA     Social History   Substance and Sexual Activity  Drug Use Yes  . Types: Benzodiazepines   Comment: Overdosed on prescription pills 3/7    Social History   Socioeconomic History  . Marital status: Divorced    Spouse name: Not on file  . Number of children: Not on file  . Years of education: Not on file  . Highest education level: Not on file  Occupational History  . Not on file  Tobacco Use  . Smoking status: Current Every Day Smoker    Packs/day: 1.00    Types: Cigarettes   . Smokeless tobacco: Never Used  Substance and Sexual Activity  . Alcohol use: Not Currently    Comment: UTA  . Drug use: Yes    Types: Benzodiazepines    Comment: Overdosed on prescription pills 3/7  . Sexual activity: Not on file  Other Topics Concern  . Not on file  Social History Narrative  . Not on file   Social Determinants of Health   Financial Resource Strain:   . Difficulty of Paying Living Expenses:   Food Insecurity:   . Worried About Charity fundraiser in the Last Year:   . Arboriculturist in the Last Year:   Transportation Needs:   . Film/video editor (Medical):   Marland Kitchen Lack of Transportation (Non-Medical):   Physical Activity:   . Days of Exercise per Week:   . Minutes of Exercise per Session:   Stress:   . Feeling of Stress :   Social Connections:   . Frequency of Communication with Friends and Family:   . Frequency of Social Gatherings with Friends and Family:   . Attends Religious Services:   . Active Member of Clubs or Organizations:   . Attends Archivist Meetings:   Marland Kitchen Marital Status:    Additional Social History:  Sleep: Fair  Appetite:  Fair  Current Medications: Current Facility-Administered Medications  Medication Dose Route Frequency Provider Last Rate Last Admin  . acetaminophen (TYLENOL) tablet 650 mg  650 mg Oral Q6H PRN Gillermo Murdoch, NP      . alum & mag hydroxide-simeth (MAALOX/MYLANTA) 200-200-20 MG/5ML suspension 30 mL  30 mL Oral Q4H PRN Gillermo Murdoch, NP      . FLUoxetine (PROZAC) capsule 10 mg  10 mg Oral Daily Clapacs, Jackquline Denmark, MD   10 mg at 10/28/19 0731  . lithium carbonate (ESKALITH) CR tablet 450 mg  450 mg Oral QHS Clapacs, Jackquline Denmark, MD   450 mg at 10/27/19 2049  . magnesium hydroxide (MILK OF MAGNESIA) suspension 30 mL  30 mL Oral Daily PRN Gillermo Murdoch, NP        Lab Results: No results found for this or any previous visit (from the past 48  hour(s)).  Blood Alcohol level:  Lab Results  Component Value Date   ETH <10 10/25/2019   ETH <10 10/08/2019    Metabolic Disorder Labs: No results found for: HGBA1C, MPG No results found for: PROLACTIN No results found for: CHOL, TRIG, HDL, CHOLHDL, VLDL, LDLCALC  Physical Findings: AIMS: Facial and Oral Movements Muscles of Facial Expression: None, normal Lips and Perioral Area: None, normal Jaw: None, normal Tongue: None, normal,Extremity Movements Upper (arms, wrists, hands, fingers): None, normal Lower (legs, knees, ankles, toes): None, normal, Trunk Movements Neck, shoulders, hips: None, normal, Overall Severity Severity of abnormal movements (highest score from questions above): None, normal Incapacitation due to abnormal movements: None, normal Patient's awareness of abnormal movements (rate only patient's report): No Awareness, Dental Status Current problems with teeth and/or dentures?: No Does patient usually wear dentures?: No  CIWA:    COWS:  COWS Total Score: 3  Musculoskeletal: Strength & Muscle Tone: within normal limits Gait & Station: normal Patient leans: N/A  Psychiatric Specialty Exam: Physical Exam  Review of Systems  Blood pressure 119/90, pulse 97, temperature 98.7 F (37.1 C), temperature source Oral, resp. rate 17, height 5\' 9"  (1.753 m), weight 70.8 kg, SpO2 99 %.Body mass index is 23.04 kg/m.  General Appearance: Casual  Eye Contact:  Fair  Speech:  Clear and Coherent and Pressured  Volume:  Increased  Mood:  Hypomanic at intervals  Affect:  Congruent  Thought Process:  Goal Directed  Orientation:  Full (Time, Place, and Person)  Thought Content:  Logical  Suicidal Thoughts:  No  Homicidal Thoughts:  No  Memory:  Immediate;   Fair Recent;   Fair Remote;   Fair  Judgement:  Fair  Insight:  Fair  Psychomotor Activity:  Normal  Concentration:  Concentration: Good and Attention Span: Good  Recall:  Good  Fund of Knowledge:  Good   Language:  Good  Akathisia:  Negative  Handed:  Right  AIMS (if indicated):     Assets:  Leisure Time Physical Health  ADL's:  Intact  Cognition:  WNL  Sleep:  Number of Hours: 6.75     Treatment Plan Summary: Daily contact with patient to assess and evaluate symptoms and progress in treatment and Medication management  Continue lithium continue low-dose antidepressant to try low-dose olanzapine at bedtime for further mood stabilization no change in precautions  Malva Diesing, MD 10/28/2019, 8:58 AM

## 2019-10-28 NOTE — Progress Notes (Signed)
Patient pleasant and cooperative. Reports reality is setting in and now he is worried about bills and car situation on the outside. Patient denies SI, HI, AVH. Paces unit, did attend group and is medication compliant. Encouragement and support offered. Safety checks maintained. Medications given as prescribed. Pt receptive and remains safe on unit with q15 min checks.

## 2019-10-29 NOTE — Progress Notes (Signed)
[facility] MD Progress Note  10/29/2019 11:37 AM Tom Smith.  MRN:  [id number] Subjective:    Patient reports his night was uneventful he believes his mood is more stable he is requesting discharge so that he can "get on with life" find his car so forth he is alert and oriented and generally cooperative I do not find him to be manic or hypomanic so he may have indeed recalibrated to a euthymic state at this point in time, probably ready for discharge tomorrow Principal Problem: Bipolar depression ([place]) Diagnosis: Principal Problem:   Bipolar depression ([place]) Active Problems:   MDD (major depressive disorder), recurrent episode, severe ([place])   Overdose of antipsychotic  Total Time spent with patient: 20 minutes  Past Psychiatric History: See eval  Past Medical History:  Past Medical History:  Diagnosis Date  . Psoriatic arthritis ([place])    History reviewed. No pertinent surgical history. Family History: History reviewed. No pertinent family history. Family Psychiatric  History: See eval Social History:  Social History   Substance and Sexual Activity  Alcohol Use Not Currently   Comment: UTA     Social History   Substance and Sexual Activity  Drug Use Yes  . Types: Benzodiazepines   Comment: Overdosed on prescription pills 3/7    Social History   Socioeconomic History  . Marital status: Divorced    Spouse name: Not on file  . Number of children: Not on file  . Years of education: Not on file  . Highest education level: Not on file  Occupational History  . Not on file  Tobacco Use  . Smoking status: Current Every Day Smoker    Packs/day: 1.00    Types: Cigarettes  . Smokeless tobacco: Never Used  Substance and Sexual Activity  . Alcohol use: Not Currently    Comment: UTA  . Drug use: Yes    Types: Benzodiazepines    Comment: Overdosed on prescription pills 3/7  . Sexual activity: Not on file  Other Topics Concern  . Not on file  Social History Narrative  . Not  on file   Social Determinants of Health   Financial Resource Strain:   . Difficulty of Paying Living Expenses:   Food Insecurity:   . Worried About Charity fundraiser in the Last Year:   . Arboriculturist in the Last Year:   Transportation Needs:   . Film/video editor (Medical):   Marland Kitchen Lack of Transportation (Non-Medical):   Physical Activity:   . Days of Exercise per Week:   . Minutes of Exercise per Session:   Stress:   . Feeling of Stress :   Social Connections:   . Frequency of Communication with Friends and Family:   . Frequency of Social Gatherings with Friends and Family:   . Attends Religious Services:   . Active Member of Clubs or Organizations:   . Attends Archivist Meetings:   Marland Kitchen Marital Status:    Additional Social History:                         Sleep: Good  Appetite:  Good  Current Medications: Current Facility-Administered Medications  Medication Dose Route Frequency Provider Last Rate Last Admin  . acetaminophen (TYLENOL) tablet 650 mg  650 mg Oral Q6H PRN Caroline Sauger, NP   650 mg at 10/28/19 2058  . alum & mag hydroxide-simeth (MAALOX/MYLANTA) 200-200-20 MG/5ML suspension 30 mL  30 mL Oral Q4H  PRN Gillermo Murdoch, NP      . FLUoxetine (PROZAC) capsule 10 mg  10 mg Oral Daily Clapacs, Jackquline Denmark, MD   10 mg at 10/29/19 2836  . lithium carbonate (ESKALITH) CR tablet 450 mg  450 mg Oral QHS Clapacs, Jackquline Denmark, MD   450 mg at 10/28/19 2057  . magnesium hydroxide (MILK OF MAGNESIA) suspension 30 mL  30 mL Oral Daily PRN Gillermo Murdoch, NP      . OLANZapine (ZYPREXA) tablet 5 mg  5 mg Oral QHS Malvin Johns, MD   5 mg at 10/28/19 2057    Lab Results: No results found for this or any previous visit (from the past 48 hour(s)).  Blood Alcohol level:  Lab Results  Component Value Date   ETH <10 10/25/2019   ETH <10 10/08/2019    Metabolic Disorder Labs: No results found for: HGBA1C, MPG No results found for:  PROLACTIN No results found for: CHOL, TRIG, HDL, CHOLHDL, VLDL, LDLCALC  Physical Findings: AIMS: Facial and Oral Movements Muscles of Facial Expression: None, normal Lips and Perioral Area: None, normal Jaw: None, normal Tongue: None, normal,Extremity Movements Upper (arms, wrists, hands, fingers): None, normal Lower (legs, knees, ankles, toes): None, normal, Trunk Movements Neck, shoulders, hips: None, normal, Overall Severity Severity of abnormal movements (highest score from questions above): None, normal Incapacitation due to abnormal movements: None, normal Patient's awareness of abnormal movements (rate only patient's report): No Awareness, Dental Status Current problems with teeth and/or dentures?: No Does patient usually wear dentures?: No  CIWA:    COWS:  COWS Total Score: 3  Musculoskeletal: Strength & Muscle Tone: within normal limits Gait & Station: normal Patient leans: N/A  Psychiatric Specialty Exam: Physical Exam  Review of Systems  Blood pressure 122/85, pulse 92, temperature 98.6 F (37 C), temperature source Oral, resp. rate 18, height 5\' 9"  (1.753 m), weight 70.8 kg, SpO2 100 %.Body mass index is 23.04 kg/m.  General Appearance: Casual  Eye Contact:  Good  Speech:  Clear and Coherent  Volume:  Normal  Mood:  Euthymic  Affect:  Appropriate  Thought Process:  Coherent and Goal Directed  Orientation:  Full (Time, Place, and Person)  Thought Content:  Logical  Suicidal Thoughts:  No  Homicidal Thoughts:  No  Memory:  Immediate;   Fair Recent;   Fair Remote;   Fair  Judgement:  Fair  Insight:  Good  Psychomotor Activity:  Normal  Concentration:  Concentration: Good  Recall:  Good  Fund of Knowledge:  Good  Language:  Good  Akathisia:  Negative  Handed:  Right  AIMS (if indicated):     Assets:  Communication Skills Desire for Improvement  ADL's:  Intact  Cognition:  WNL  Sleep:  Number of Hours: 7     Treatment Plan Summary: Daily contact  with patient to assess and evaluate symptoms and progress in treatment and Medication management  Continue mood stabilization therapy to include lithium low-dose olanzapine check lithium level in the morning continue fluoxetine for dysphoria no change in precautions probable discharge tomorrow  , MD 10/29/2019, 11:37 AM

## 2019-10-29 NOTE — [facility] Group Notes (Signed)
LCSW Group Therapy Notes  Date and Time: 10/29/19 1:00PM  Type of Therapy and Topic: Group Therapy: Healthy Vs. Unhealthy Coping Strategies  Participation Level: [facility] PARTICIPATION LEVEL: Did Not Attend  Description of Group:  In this group, patients will be encouraged to explore their healthy and unhealthy coping strategics. Coping strategies are actions that we take to deal with stress, problems, or uncomfortable emotions in our daily lives. Each patient will be challenged to read some scenarios and discuss the unhealthy and healthy coping strategies within those scenarios. Also, each patient will be challenged to describe current healthy and unhealthy strategies that they use in their own lives and discuss the outcomes and barriers to those strategies. This group will be process-oriented, with patients participating in exploration of their own experiences as well as giving and receiving support and challenge from other group members.  Therapeutic Goals: 1. Patient will identify personal healthy and unhealthy coping strategies. 2. Patient will identify healthy and unhealthy coping strategies, in others, through scenarios.  3. Patient will identify expected outcomes of healthy and unhealthy coping strategies. 4. Patient will identify barriers to using healthy coping strategies.   Summary of Patient Progress:  X   Therapeutic Modalities:  Cognitive Behavioral Therapy Solution Focused Therapy Motivational Interviewing   Mont Hutton [place], MSW, LCSWA Clinical Social Worker    

## 2019-10-29 NOTE — Progress Notes (Signed)
D: Patient discussed his most recent suicide attempt. Talking about how he feels he has hit rock bottom. Denies current SI and contracts for safety. Pleasant and cooperative. Contracts for safety A: Continue to monitor for safety R: Safety maintained.

## 2019-10-29 NOTE — Plan of Care (Signed)
  Problem: Education: Goal: Knowledge of [facility] Education information/materials will improve Outcome: Progressing Goal: Emotional status will improve Outcome: Progressing Goal: Mental status will improve Outcome: Progressing Goal: Verbalization of understanding the information provided will improve Outcome: Progressing  D: Patient discussed his most recent suicide attempt. Talking about how he feels he has hit rock bottom. Denies current SI and contracts for safety. Pleasant and cooperative. Contracts for safety A: Continue to monitor for safety R: Safety maintained.

## 2019-10-29 NOTE — Progress Notes (Signed)
F:  Focus of care on stabilizing mood and managing suicidal thoughts.  D:  The patient was visible in the milieu, social, and behaviorally appropriate.  He endorsed decreased levels of anxiety and less intense symptoms of depression.  Tom Smith stated, "I think these medications are the ones that will help.  That other stuff wasn't working and made things worse for me."  Adequate food and fluids consumed, vital signs stable, and no problematic side effects from medication changes reported.       A:  Continue to monitor for safety and overall improvements in functioning.  Frequently assess mood and thoughts of self-harm.  15-minute safety checks.  R:  Mood steadily improving.  Anxiety rated a 6 out of 10.

## 2019-10-29 NOTE — Plan of Care (Signed)
Patient endorsing overall improvements in mood and functioning.  Problem: Education: Goal: Ability to make informed decisions regarding treatment will improve Outcome: Progressing   Problem: Coping: Goal: Coping ability will improve Outcome: Progressing   Problem: Education: Goal: Ability to state activities that reduce stress will improve Outcome: Progressing

## 2019-10-30 LAB — LITHIUM LEVEL: Lithium Lvl: 0.38 mmol/L — ABNORMAL LOW (ref 0.60–1.20)

## 2019-10-30 MED ORDER — LITHIUM CARBONATE ER 300 MG PO TBCR: 600.0000 mg | EXTENDED_RELEASE_TABLET | Freq: Every day | ORAL | 1 refills | Status: AC

## 2019-10-30 MED ORDER — FLUOXETINE HCL 10 MG PO CAPS: 10.0000 mg | ORAL_CAPSULE | Freq: Every day | ORAL | 0 refills | Status: DC

## 2019-10-30 MED ORDER — LITHIUM CARBONATE ER 300 MG PO TBCR: 600.0000 mg | EXTENDED_RELEASE_TABLET | Freq: Every day | ORAL | 0 refills | Status: DC

## 2019-10-30 MED ORDER — OLANZAPINE 5 MG PO TABS: 5.0000 mg | ORAL_TABLET | Freq: Every day | ORAL | 1 refills | Status: AC

## 2019-10-30 MED ORDER — OLANZAPINE 5 MG PO TABS: 5.0000 mg | ORAL_TABLET | Freq: Every day | ORAL | 0 refills | Status: DC

## 2019-10-30 MED ORDER — LITHIUM CARBONATE ER 300 MG PO TBCR: 600.0000 mg | EXTENDED_RELEASE_TABLET | Freq: Every day | ORAL | Status: DC

## 2019-10-30 MED ORDER — FLUOXETINE HCL 10 MG PO CAPS: 10.0000 mg | ORAL_CAPSULE | Freq: Every day | ORAL | 1 refills | Status: AC

## 2019-10-30 NOTE — Progress Notes (Signed)
Patient denies SI/HI, denies A/V hallucinations. Patient verbalizes understanding of discharge instructions, follow up care and prescriptions.7 days medicines given to patient. Patient given all belongings from [facility] locker. Patient escorted out by staff, transported by family. 

## 2019-10-30 NOTE — Plan of Care (Signed)
  Problem: Education: Goal: Knowledge of [facility] Education information/materials will improve Outcome: Progressing Goal: Emotional status will improve Outcome: Progressing Goal: Mental status will improve Outcome: Progressing Goal: Verbalization of understanding the information provided will improve Outcome: Progressing  D: Patient has been pleasant, calm and cooperative. Denies SI, HI and AVH. Says he feels ready to go home. Medication compliant. A: Continue to monitor for safety R: Safety maintained.

## 2019-10-30 NOTE — Progress Notes (Signed)
Recreation Therapy Notes  INPATIENT RECREATION TR PLAN  Patient Details Name: Tom Smith. MRN: [id number] DOB: 10/26/1965 Today's Date: 10/30/2019  Rec Therapy Plan Is patient appropriate for Therapeutic Recreation?: Yes Treatment times per week: at least 3 Estimated Length of Stay: 5-7 days TR Treatment/Interventions: Group participation (Comment)  Discharge Criteria Pt will be discharged from therapy if:: Discharged Treatment plan/goals/alternatives discussed and agreed upon by:: Patient/family  Discharge Summary Short term goals set: Patient will engage in groups without prompting or encouragement from LRT x3 group sessions within 5 recreation therapy group sessions Short term goals met: Complete Progress toward goals comments: Groups attended Which groups?: Coping skills, Other (Comment)(Happiness,Relaxation) Reason goals not met: N/A Therapeutic equipment acquired: N/A Reason patient discharged from therapy: Discharge from hospital Pt/family agrees with progress & goals achieved: Yes Date patient discharged from therapy: 10/30/19   Deette Revak 10/30/2019, 2:31 PM

## 2019-10-30 NOTE — Progress Notes (Signed)
D: Patient has been pleasant, calm and cooperative. Denies SI, HI and AVH. Says he feels ready to go home. Medication compliant. A: Continue to monitor for safety R: Safety maintained.

## 2019-10-30 NOTE — Progress Notes (Signed)
  [facility] Adult Case Management Discharge Plan :  Will you be returning to the same living situation after discharge:  Yes,  mothers home At discharge, do you have transportation home?: Yes,  pts sister will pick him up at 5pm Do you have the ability to pay for your medications: Yes,  mental health  Release of information consent forms completed and in the chart;    Patient to Follow up at: Follow-up Information    [facility] Follow up on 11/03/2019.   Why: Meeting is scheduled for Friday 11/03/2019, at 7AM.  Meeting is via Zoom, with Lorella Nimrod, Peer Support Specialist. Contact information: [address] [place] [address] [phone]           Next level of care provider has access to [facility] Link:no  Safety Planning and Suicide Prevention discussed: Yes,  SPE completed with pts sister  Have you used any form of tobacco in the last 30 days? (Cigarettes, Smokeless Tobacco, Cigars, and/or Pipes): Yes  Has patient been referred to the Quitline?: Patient refused referral  Patient has been referred for addiction treatment: Pt. refused referral  Mechele Dawley, LCSW 10/30/2019, 9:40 AM

## 2019-10-30 NOTE — Discharge Summary (Signed)
Physician Discharge Summary Note  Patient:  Tom Smith. is an 54 y.o., male MRN:  [id number] DOB:  11-Jan-1966 Patient phone:  [phone] (home)  Patient address:   [address] [place] [address],  Total Time spent with patient: 30 minutes  Date of Admission:  10/25/2019 Date of Discharge: 10/30/19  Reason for Admission:  54 year old man with a history of depression and probably bipolar disorder who comes to the emergency room after taking an overdose of Seroquel.  Patient says that for the past week and in fact ever since his discharge he had been feeling on edge.  Never really felt much better and continued to feel anxious and depressed and hopeless.  Felt overwhelmed by multiple life stresses and a feeling that nothing would get better.  He went to a park on the night of admission and took a long what was left of his Seroquel and took all of it.  He estimates 21 pills and says that he was hoping to die.  Principal Problem: Bipolar depression [facility]) Discharge Diagnoses: Principal Problem:   Bipolar depression (HCC) Active Problems:   MDD (major depressive disorder), recurrent episode, severe (HCC)   Overdose of antipsychotic   Past Psychiatric History: Patient has a past history of mood disorder.  He is relatively new to our area and we do not have many records but from what he describes he has probably had a manic episode as recently as this past summer.  It sounds like he mostly avoided treatment for a long time and in the past had struggled with substance abuse.  This is now his second hospitalization 1 after another both of them with suicide attempts.  Past Medical History:  Past Medical History:  Diagnosis Date  . Psoriatic arthritis (HCC)    History reviewed. No pertinent surgical history. Family History: History reviewed. No pertinent family history. Family Psychiatric  History: There is reportedly a family history of bipolar disorder most notably in his  sister Social History:  Social History   Substance and Sexual Activity  Alcohol Use Not Currently   Comment: UTA     Social History   Substance and Sexual Activity  Drug Use Yes  . Types: Benzodiazepines   Comment: Overdosed on prescription pills 3/7    Social History   Socioeconomic History  . Marital status: Divorced    Spouse name: Not on file  . Number of children: Not on file  . Years of education: Not on file  . Highest education level: Not on file  Occupational History  . Not on file  Tobacco Use  . Smoking status: Current Every Day Smoker    Packs/day: 1.00    Types: Cigarettes  . Smokeless tobacco: Never Used  Substance and Sexual Activity  . Alcohol use: Not Currently    Comment: UTA  . Drug use: Yes    Types: Benzodiazepines    Comment: Overdosed on prescription pills 3/7  . Sexual activity: Not on file  Other Topics Concern  . Not on file  Social History Narrative  . Not on file   Social Determinants of Health   Financial Resource Strain:   . Difficulty of Paying Living Expenses:   Food Insecurity:   . Worried About Programme researcher, broadcasting/film/video in the Last Year:   . Barista in the Last Year:   Transportation Needs:   . Freight forwarder (Medical):   Marland Kitchen Lack of Transportation (Non-Medical):   Physical Activity:   .  Days of Exercise per Week:   . Minutes of Exercise per Session:   Stress:   . Feeling of Stress :   Social Connections:   . Frequency of Communication with Friends and Family:   . Frequency of Social Gatherings with Friends and Family:   . Attends Religious Services:   . Active Member of Clubs or Organizations:   . Attends Archivist Meetings:   Marland Kitchen Marital Status:     Hospital Course:  Patient remained on the [facility] unit for 4 days. The patient stabilized on medication and therapy. Patient was discharged on Prozac 10 mg Daily, Lithium 600 mg QHS, and Zyprexa 5 mg QHS. Patient has shown improvement with improved mood,  affect, sleep, appetite, and interaction. Patient has attended group and participated. Patient has been seen in the day room interacting with peers and staff appropriately. Patient denies any SI/HI/AVH and contracts for safety. Patient agrees to follow up at [facility]. Patient is provided with prescriptions for their medications upon discharge.  Physical Findings: AIMS: Facial and Oral Movements Muscles of Facial Expression: None, normal Lips and Perioral Area: None, normal Jaw: None, normal Tongue: None, normal,Extremity Movements Upper (arms, wrists, hands, fingers): None, normal Lower (legs, knees, ankles, toes): None, normal, Trunk Movements Neck, shoulders, hips: None, normal, Overall Severity Severity of abnormal movements (highest score from questions above): None, normal Incapacitation due to abnormal movements: None, normal Patient's awareness of abnormal movements (rate only patient's report): No Awareness, Dental Status Current problems with teeth and/or dentures?: No Does patient usually wear dentures?: No  CIWA:    COWS:  COWS Total Score: 3  Musculoskeletal: Strength & Muscle Tone: within normal limits Gait & Station: normal Patient leans: N/A  Psychiatric Specialty Exam: Physical Exam  Nursing note and vitals reviewed. Constitutional: He is oriented to person, place, and time. He appears well-developed and well-nourished.  Cardiovascular: Normal rate.  Respiratory: Effort normal.  Musculoskeletal:        General: Normal range of motion.  Neurological: He is alert and oriented to person, place, and time.  Skin: Skin is warm.    Review of Systems  Constitutional: Negative.   HENT: Negative.   Eyes: Negative.   Respiratory: Negative.   Cardiovascular: Negative.   Gastrointestinal: Negative.   Genitourinary: Negative.   Musculoskeletal: Negative.   Skin: Negative.   Neurological: Negative.   Psychiatric/Behavioral: Negative.     Blood pressure 97/84, pulse 100,  temperature 98.1 F (36.7 C), temperature source Oral, resp. rate 18, height 5\' 9"  (1.753 m), weight 70.8 kg, SpO2 96 %.Body mass index is 23.04 kg/m.   General Appearance: Casual  Eye Contact::  Good  Speech:  Clear and Coherent409  Volume:  Normal  Mood:  Euthymic  Affect:  Constricted  Thought Process:  Coherent  Orientation:  Full (Time, Place, and Person)  Thought Content:  Logical  Suicidal Thoughts:  No  Homicidal Thoughts:  No  Memory:  Immediate;   Fair Recent;   Fair Remote;   Fair  Judgement:  Fair  Insight:  Fair  Psychomotor Activity:  Normal  Concentration:  Fair  Recall:  [employer] of Knowledge:Fair  Language: Fair  Akathisia:  No  Handed:  Right  AIMS (if indicated):     Assets:  Communication Skills  Sleep:  Number of Hours: 7  Cognition: WNL  ADL's:  Intact   Have you used any form of tobacco in the last 30 days? (Cigarettes, Smokeless Tobacco, Cigars, and/or Pipes): Yes  Has this patient used any form of tobacco in the last 30 days? (Cigarettes, Smokeless Tobacco, Cigars, and/or Pipes) Yes, Yes, A prescription for an FDA-approved tobacco cessation medication was offered at discharge and the patient refused  Blood Alcohol level:  Lab Results  Component Value Date   ETH <10 10/25/2019   ETH <10 10/08/2019    Metabolic Disorder Labs:  No results found for: HGBA1C, MPG No results found for: PROLACTIN No results found for: CHOL, TRIG, HDL, CHOLHDL, VLDL, LDLCALC  See Psychiatric Specialty Exam and Suicide Risk Assessment completed by Attending Physician prior to discharge.  Discharge destination:  Home  Is patient on multiple antipsychotic therapies at discharge:  No   Has Patient had three or more failed trials of antipsychotic monotherapy by history:  No  Recommended Plan for Multiple Antipsychotic Therapies: NA  Discharge Instructions    Diet - low sodium heart healthy   Complete by: As directed    Increase activity slowly   Complete by:  As directed      Allergies as of 10/30/2019   No Known Allergies     Medication List    STOP taking these medications   QUEtiapine 300 MG tablet Commonly known as: SEROQUEL     TAKE these medications     Indication  FLUoxetine 10 MG capsule Commonly known as: PROZAC Take 1 capsule (10 mg total) by mouth daily. Start taking on: October 31, 2019  Indication: Depression   lithium carbonate 300 MG CR tablet Commonly known as: LITHOBID Take 2 tablets (600 mg total) by mouth at bedtime.  Indication: Depressive Phase of Manic-Depression, Major Depressive Disorder   OLANZapine 5 MG tablet Commonly known as: ZYPREXA Take 1 tablet (5 mg total) by mouth at bedtime.  Indication: Depressive Phase of Manic-Depression      Follow-up Information    [facility] Follow up on 11/03/2019.   Why: Meeting is scheduled for Friday 11/03/2019, at 7AM.  Meeting is via Zoom, with Lorella Nimrod, Peer Support Specialist. Contact information: [address] [place] [address] [phone]           Follow-up recommendations:  Continue activity as tolerated. Continue diet as recommended by your PCP. Ensure to keep all appointments with outpatient providers.  Comments:  Patient is instructed prior to discharge to: Take all medications as prescribed by his/her mental healthcare provider. Report any adverse effects and or reactions from the medicines to his/her outpatient provider promptly. Patient has been instructed & cautioned: To not engage in alcohol and or illegal drug use while on prescription medicines. In the event of worsening symptoms, patient is instructed to call the crisis hotline, 911 and or go to the nearest ED for appropriate evaluation and treatment of symptoms. To follow-up with his/her primary care provider for your other medical issues, concerns and or health care needs.    Signed: Gerlene Burdock Money, FNP 10/30/2019, 9:32 AM

## 2019-10-30 NOTE — [facility] Group Notes (Signed)
[facility] Group Notes:  (Nursing/MHT/Case Management/Adjunct)  Date:  10/30/2019  Time:  9:56 AM  Type of Therapy:  Community Meeting  Participation Level:  Active  Participation Quality:  Appropriate, Attentive and Sharing  Affect:  Appropriate  Cognitive:  Alert and Appropriate  Insight:  Appropriate and Good  Engagement in Group:  Engaged  Modes of Intervention:  Discussion, Education and Support  Summary of Progress/Problems:  Lynelle Smoke Christon Parada 10/30/2019, 9:56 AM

## 2019-10-30 NOTE — [facility] Group Notes (Signed)
LCSW Group Therapy Note  10/30/2019 1:49 PM  Type of Therapy and Topic:  Group Therapy:  Feelings around Relapse and Recovery  Participation Level:  Did Not Attend   Description of Group:    Patients in this group will discuss emotions they experience before and after a relapse. They will process how experiencing these feelings, or avoidance of experiencing them, relates to having a relapse. Facilitator will guide patients to explore emotions they have related to recovery. Patients will be encouraged to process which emotions are more powerful. They will be guided to discuss the emotional reaction significant others in their lives may have to their relapse or recovery. Patients will be assisted in exploring ways to respond to the emotions of others without this contributing to a relapse.  Therapeutic Goals: 1. Patient will identify two or more emotions that lead to a relapse for them 2. Patient will identify two emotions that result when they relapse 3. Patient will identify two emotions related to recovery 4. Patient will demonstrate ability to communicate their needs through discussion and/or role plays   Summary of Patient Progress: x    Therapeutic Modalities:   Cognitive Behavioral Therapy Solution-Focused Therapy Assertiveness Training Relapse Prevention Therapy   Iris Pert, MSW, LCSW Clinical Social Work 10/30/2019 1:49 PM

## 2019-10-30 NOTE — Progress Notes (Signed)
Recreation Therapy Notes   Date: 10/30/2019  Time: 9:30 am  Location: Craft Room  Behavioral response: Appropriate   Intervention Topic: Coping skills   Discussion/Intervention:  Group content on today was focused on coping skills. The group defined what coping skills are and when they can be used. Individuals described how they normally cope with thing and the coping skills they normally use. Patients expressed why it is important to cope with things and how not coping with things can affect you. The group participated in the intervention "My coping box" and made coping boxes while adding coping skills they could use in the future to the box. Clinical Observations/Feedback:  Patient came to group and was focused on what peers and staff had to say about coping skills. Individual was social with peers and staff while participant in the intervention during group.  Abbee Cremeens LRT/CTRS         Solangel Mcmanaway 10/30/2019 1:28 PM

## 2019-10-30 NOTE — [facility] Suicide Risk Assessment (Signed)
[facility] Discharge Suicide Risk Assessment   Principal Problem: Bipolar depression [facility]) Discharge Diagnoses: Principal Problem:   Bipolar depression (HCC) Active Problems:   MDD (major depressive disorder), recurrent episode, severe (HCC)   Overdose of antipsychotic   Total Time spent with patient: 30 minutes  Musculoskeletal: Strength & Muscle Tone: within normal limits Gait & Station: normal Patient leans: N/A  Psychiatric Specialty Exam: Review of Systems  Constitutional: Negative.   HENT: Negative.   Eyes: Negative.   Respiratory: Negative.   Cardiovascular: Negative.   Gastrointestinal: Negative.   Musculoskeletal: Negative.   Skin: Negative.   Neurological: Negative.   Psychiatric/Behavioral: Negative.     Blood pressure 97/84, pulse 100, temperature 98.1 F (36.7 C), temperature source Oral, resp. rate 18, height 5\' 9"  (1.753 m), weight 70.8 kg, SpO2 96 %.Body mass index is 23.04 kg/m.  General Appearance: Casual  Eye Contact::  Good  Speech:  Clear and Coherent409  Volume:  Normal  Mood:  Euthymic  Affect:  Constricted  Thought Process:  Coherent  Orientation:  Full (Time, Place, and Person)  Thought Content:  Logical  Suicidal Thoughts:  No  Homicidal Thoughts:  No  Memory:  Immediate;   Fair Recent;   Fair Remote;   Fair  Judgement:  Fair  Insight:  Fair  Psychomotor Activity:  Normal  Concentration:  Fair  Recall:  [phone] of Knowledge:Fair  Language: Fair  Akathisia:  No  Handed:  Right  AIMS (if indicated):     Assets:  Communication Skills  Sleep:  Number of Hours: 7  Cognition: WNL  ADL's:  Intact   Mental Status Per Nursing Assessment::   On Admission:  NA  Demographic Factors:  Male, Divorced or widowed, Caucasian and Unemployed  Loss Factors: Financial problems/change in socioeconomic status  Historical Factors: Prior suicide attempts and Impulsivity  Risk Reduction Factors:   Sense of responsibility to family, Religious  beliefs about death, Living with another person, especially a relative, Positive social support and Positive therapeutic relationship  Continued Clinical Symptoms:  Bipolar Disorder:   Depressive phase Depression:   Impulsivity  Cognitive Features That Contribute To Risk:  None    Suicide Risk:  Minimal: No identifiable suicidal ideation.  Patients presenting with no risk factors but with morbid ruminations; may be classified as minimal risk based on the severity of the depressive symptoms  Follow-up Information    [facility] Follow up on 11/03/2019.   Why: Meeting is scheduled for Friday 11/03/2019, at 7AM.  Meeting is via Zoom, with 01/03/2020, Peer Support Specialist. Contact information: [address] [place] [phone]           Plan Of Care/Follow-up recommendations:  Activity:  Activity as tolerated Diet:  Regular diet Other:  Follow-up outpatient treatment as directed  [phone], MD 10/30/2019, 12:03 PM

## 2021-10-21 ENCOUNTER — Other Ambulatory Visit: Payer: Self-pay

## 2021-10-21 ENCOUNTER — Emergency Department: Payer: BC Managed Care – PPO

## 2021-10-21 ENCOUNTER — Emergency Department
Admission: EM | Admit: 2021-10-21 | Discharge: 2021-10-21 | Disposition: A | Discharge status: Home / Self Care | Payer: BC Managed Care – PPO | Attending: Emergency Medicine | Admitting: Emergency Medicine

## 2021-10-21 ENCOUNTER — Encounter: Payer: Self-pay | Admitting: Emergency Medicine

## 2021-10-21 DIAGNOSIS — H81399 Other peripheral vertigo, unspecified ear: Secondary | ICD-10-CM

## 2021-10-21 DIAGNOSIS — R739 Hyperglycemia, unspecified: Secondary | ICD-10-CM | POA: Diagnosis not present

## 2021-10-21 DIAGNOSIS — R112 Nausea with vomiting, unspecified: Secondary | ICD-10-CM | POA: Diagnosis not present

## 2021-10-21 DIAGNOSIS — R42 Dizziness and giddiness: Secondary | ICD-10-CM | POA: Diagnosis present

## 2021-10-21 LAB — BASIC METABOLIC PANEL
Anion gap: 8 (ref 5–15)
BUN: 14 mg/dL (ref 6–20)
CO2: 25 mmol/L (ref 22–32)
Calcium: 8.7 mg/dL — ABNORMAL LOW (ref 8.9–10.3)
Chloride: 103 mmol/L (ref 98–111)
Creatinine, Ser: 0.82 mg/dL (ref 0.61–1.24)
GFR, Estimated: >60 mL/min (ref >60)
Glucose, Bld: 94 mg/dL (ref 70–99)
Potassium: 4 mmol/L (ref 3.5–5.1)
Sodium: 136 mmol/L (ref 135–145)

## 2021-10-21 LAB — URINALYSIS, ROUTINE W REFLEX MICROSCOPIC
Bilirubin Urine: NEGATIVE
Glucose, UA: NEGATIVE mg/dL
Hgb urine dipstick: NEGATIVE
Ketones, ur: NEGATIVE mg/dL
Leukocytes,Ua: NEGATIVE
Nitrite: NEGATIVE
Protein, ur: NEGATIVE mg/dL
Specific Gravity, Urine: 1.017 (ref 1.005–1.030)
pH: 7 (ref 5.0–8.0)

## 2021-10-21 LAB — CBC
HCT: 44.9 % (ref 39.0–52.0)
Hemoglobin: 15.2 g/dL (ref 13.0–17.0)
MCH: 33.6 pg (ref 26.0–34.0)
MCHC: 33.9 g/dL (ref 30.0–36.0)
MCV: 99.1 fL (ref 80.0–100.0)
Platelets: 302 10*3/uL (ref 150–400)
RBC: 4.53 MIL/uL (ref 4.22–5.81)
RDW: 12.7 % (ref 11.5–15.5)
WBC: 10.5 10*3/uL (ref 4.0–10.5)
nRBC: 0 % (ref 0.0–0.2)

## 2021-10-21 MED ORDER — MECLIZINE HCL 25 MG PO TABS: 25.0000 mg | ORAL_TABLET | Freq: Three times a day (TID) | ORAL | 0 refills | Status: AC | PRN

## 2021-10-21 NOTE — ED Notes (Signed)
Pt ambulatory in room; steady.  ?

## 2021-10-21 NOTE — ED Provider Notes (Signed)
? ?[facility] ?Provider Note ? ? ? Event Date/Time  ? First MD Initiated Contact with Patient 10/21/21 1059   ?  (approximate) ? ? ?History  ? ?Dizziness ? ? ?HPI ? ?Tom Smith. is a 56 y.o. male with no active medical problems who presents with dizziness, acute onset yesterday.  He describes it as a sensation of everything moving or swinging around him, and he feels somewhat unsteady with walking.  He states initially when he got up and started moving around yesterday morning he got very nauseous and vomited once.  The dizziness started after this.  He states it is mild in intensity now.  It gets worse if he bends over or moves around although not severely so.  He denies any further vomiting.  The patient also states that he feels as if he is slightly foggy and has difficulty concentrating.  He denies headache, weakness or numbness, or any change in speech or vision.  He has no new tinnitus or hearing loss. ? ? ? ?Physical Exam  ? ?Triage Vital Signs: ?ED Triage Vitals  ?Enc Vitals Group  ?   BP 10/21/21 1032 111/78  ?   Pulse Rate 10/21/21 1032 70  ?   Resp 10/21/21 1032 16  ?   Temp 10/21/21 1032 97.9 ?F (36.6 ?C)  ?   Temp Source 10/21/21 1032 Oral  ?   SpO2 10/21/21 1032 100 %  ?   Weight 10/21/21 1033 143 lb (64.9 kg)  ?   Height 10/21/21 1033 5\' 10"  (1.778 m)  ?   Head Circumference --   ?   Peak Flow --   ?   Pain Score 10/21/21 1033 0  ?   Pain Loc --   ?   Pain Edu? --   ?   Excl. in GC? --   ? ? ?Most recent vital signs: ?Vitals:  ? 10/21/21 1032 10/21/21 1240  ?BP: 111/78 123/80  ?Pulse: 70 63  ?Resp: 16 18  ?Temp: 97.9 ?F (36.6 ?C)   ?SpO2: 100% 97%  ? ? ?General: Awake, no distress.  ?CV:  Good peripheral perfusion.  ?Resp:  Normal effort.  ?Abd:  No distention.  ?Other:  Normal TMs bilaterally.  EOMI.  PERRLA.  No nystagmus.  Cranial nerves III through XII grossly intact.  Motor and sensory intact in all extremities.  Normal gait.  No pronator drift.  Normal coordination  with no ataxia on finger-to-nose. ? ? ?ED Results / Procedures / Treatments  ? ?Labs ?(all labs ordered are listed, but only abnormal results are displayed) ?Labs Reviewed  ?BASIC METABOLIC PANEL - Abnormal; Notable for the following components:  ?    Result Value  ? Calcium 8.7 (*)   ? All other components within normal limits  ?URINALYSIS, ROUTINE W REFLEX MICROSCOPIC - Abnormal; Notable for the following components:  ? Color, Urine YELLOW (*)   ? APPearance CLOUDY (*)   ? All other components within normal limits  ?CBC  ? ? ? ?EKG ? ?ED ECG REPORT ?[id number], the attending physician, personally viewed and interpreted this ECG. ? ?Date: 10/21/2021 ?EKG Time: 1038 ?Rate: 72 ?Rhythm: normal sinus rhythm ?QRS Axis: normal ?Intervals: normal ?ST/T Wave abnormalities: normal ?Narrative Interpretation: no evidence of acute ischemia ? ? ? ?RADIOLOGY ? ?CT head: I independently viewed and interpreted the images; there is no evidence of ICH or acute ischemic stroke ? ?PROCEDURES: ? ?Critical Care performed: No ? ?Procedures ? ? ?  MEDICATIONS ORDERED IN ED: ?Medications - No data to display ? ? ?IMPRESSION / MDM / ASSESSMENT AND PLAN / ED COURSE  ?I reviewed the triage vital signs and the nursing notes. ? ?56 year old male with no active medical problems presents with vertigo type dizziness since yesterday relatively mild in intensity, acute onset, and worse with movement.  He also has a sensation of slight brain fog or difficulty concentrating, but no other focal neurologic symptoms. ? ?On exam the patient is well-appearing.  His vital signs are normal.  The physical exam is otherwise unremarkable.  Neurologic exam is normal. ? ?Overall presentation is most consistent with peripheral vertigo given the acute onset, presence of nausea, and symptoms worsened with movement differential includes dehydration, electrolyte abnormality, hyperglycemia, or other metabolic cause.  I do not suspect cardiac etiology, and the  EKG is normal.  There is no indication for further cardiac work-up.  I also have a low suspicion for central vertigo or other CNS cause, however given the patient's age we will obtain a CT. ? ?----------------------------------------- ?2:44 PM on 10/21/2021 ?----------------------------------------- ? ?CT was negative.  I discussed the patient the remote possibility of central vertigo caused by a stroke, though my clinical suspicion is very low.  I did offer to do an MRI, however the patient declines at this time.  He continues to appear well, is ambulating without difficulty, and overall presentation remains consistent with mild peripheral vertigo. ? ?I gave the patient thorough return precautions and he expressed understanding.  I prescribed meclizine. ? ? ?FINAL CLINICAL IMPRESSION(S) / ED DIAGNOSES  ? ?Final diagnoses:  ?Peripheral vertigo, unspecified laterality  ? ? ? ?Rx / DC Orders  ? ?ED Discharge Orders   ? ?      Ordered  ?  meclizine (ANTIVERT) 25 MG tablet  3 times daily PRN       ? 10/21/21 1300  ? ?  ?  ? ?  ? ? ? ?Note:  This document was prepared using Dragon voice recognition software and may include unintentional dictation errors.  ?  Dionne Bucy, MD ?10/21/21 1445 ? ?

## 2021-10-21 NOTE — ED Triage Notes (Signed)
Pt states that at 0730 yesterday am he had a sudden onset of nausea and then felt dizzy, states that the dizzy got better as the day went on but never left and states he cont to feel like he is swaying when he stands, denies pain, denies numbness or tingling ?

## 2021-10-21 NOTE — Discharge Instructions (Addendum)
Return to the ER for new, worsening, or persistent severe dizziness, vomiting, unsteadiness on her feet, difficulty walking or with coordination, vision changes, weakness or numbness, or any other new or worsening symptoms that concern you. ?

## 2021-12-26 ENCOUNTER — Other Ambulatory Visit: Payer: Self-pay

## 2021-12-26 ENCOUNTER — Emergency Department
Admission: EM | Admit: 2021-12-26 | Discharge: 2021-12-26 | Disposition: A | Discharge status: Home / Self Care | Payer: BC Managed Care – PPO | Attending: Emergency Medicine | Admitting: Emergency Medicine

## 2021-12-26 ENCOUNTER — Other Ambulatory Visit: Payer: BC Managed Care – PPO

## 2021-12-26 ENCOUNTER — Emergency Department: Payer: BC Managed Care – PPO

## 2021-12-26 DIAGNOSIS — K219 Gastro-esophageal reflux disease without esophagitis: Secondary | ICD-10-CM | POA: Diagnosis not present

## 2021-12-26 DIAGNOSIS — R197 Diarrhea, unspecified: Secondary | ICD-10-CM | POA: Diagnosis not present

## 2021-12-26 DIAGNOSIS — R112 Nausea with vomiting, unspecified: Secondary | ICD-10-CM | POA: Diagnosis present

## 2021-12-26 DIAGNOSIS — E871 Hypo-osmolality and hyponatremia: Secondary | ICD-10-CM | POA: Diagnosis not present

## 2021-12-26 DIAGNOSIS — E860 Dehydration: Secondary | ICD-10-CM | POA: Insufficient documentation

## 2021-12-26 LAB — CBC
HCT: 46 % (ref 39.0–52.0)
Hemoglobin: 16.2 g/dL (ref 13.0–17.0)
MCH: 34 pg (ref 26.0–34.0)
MCHC: 35.2 g/dL (ref 30.0–36.0)
MCV: 96.6 fL (ref 80.0–100.0)
Platelets: 317 10*3/uL (ref 150–400)
RBC: 4.76 MIL/uL (ref 4.22–5.81)
RDW: 12.1 % (ref 11.5–15.5)
WBC: 15.1 10*3/uL — ABNORMAL HIGH (ref 4.0–10.5)
nRBC: 0 % (ref 0.0–0.2)

## 2021-12-26 LAB — LITHIUM LEVEL: Lithium Lvl: <0.06 mmol/L — ABNORMAL LOW (ref 0.60–1.20)

## 2021-12-26 LAB — COMPREHENSIVE METABOLIC PANEL
ALT: 33 U/L (ref 0–44)
AST: 31 U/L (ref 15–41)
Albumin: 4.1 g/dL (ref 3.5–5.0)
Alkaline Phosphatase: 89 U/L (ref 38–126)
Anion gap: 12 (ref 5–15)
BUN: 20 mg/dL (ref 6–20)
CO2: 29 mmol/L (ref 22–32)
Calcium: 9.5 mg/dL (ref 8.9–10.3)
Chloride: 87 mmol/L — ABNORMAL LOW (ref 98–111)
Creatinine, Ser: 0.94 mg/dL (ref 0.61–1.24)
GFR, Estimated: >60 mL/min (ref >60)
Glucose, Bld: 131 mg/dL — ABNORMAL HIGH (ref 70–99)
Potassium: 3.4 mmol/L — ABNORMAL LOW (ref 3.5–5.1)
Sodium: 128 mmol/L — ABNORMAL LOW (ref 135–145)
Total Bilirubin: 1.3 mg/dL — ABNORMAL HIGH (ref 0.3–1.2)
Total Protein: 7.1 g/dL (ref 6.5–8.1)

## 2021-12-26 LAB — URINALYSIS, ROUTINE W REFLEX MICROSCOPIC
Bacteria, UA: NONE SEEN
Bilirubin Urine: NEGATIVE
Glucose, UA: NEGATIVE mg/dL
Ketones, ur: 5 mg/dL — AB
Leukocytes,Ua: NEGATIVE
Nitrite: NEGATIVE
Protein, ur: NEGATIVE mg/dL
Specific Gravity, Urine: 1.019 (ref 1.005–1.030)
pH: 6 (ref 5.0–8.0)

## 2021-12-26 LAB — TROPONIN I (HIGH SENSITIVITY): Troponin I (High Sensitivity): 8 ng/L (ref <18)

## 2021-12-26 LAB — LIPASE, BLOOD: Lipase: 71 U/L — ABNORMAL HIGH (ref 11–51)

## 2021-12-26 MED ORDER — PANTOPRAZOLE SODIUM 40 MG PO TBEC: 40.0000 mg | DELAYED_RELEASE_TABLET | Freq: Every day | ORAL | 0 refills | Status: AC

## 2021-12-26 MED ORDER — ONDANSETRON 4 MG PO TBDP: 4.0000 mg | ORAL_TABLET | Freq: Three times a day (TID) | ORAL | 0 refills | Status: AC | PRN

## 2021-12-26 MED ORDER — SODIUM CHLORIDE 0.9 % IV BOLUS
1000.0000 mL | Freq: Once | INTRAVENOUS | Status: AC
  Administered 2021-12-26: 1000 mL via INTRAVENOUS

## 2021-12-26 MED ORDER — ONDANSETRON HCL 4 MG/2ML IJ SOLN
4.0000 mg | Freq: Once | INTRAMUSCULAR | Status: AC
  Administered 2021-12-26: 4 mg via INTRAVENOUS
  Filled 2021-12-26: qty 2

## 2021-12-26 MED ORDER — IOHEXOL 350 MG/ML SOLN
100.0000 mL | Freq: Once | INTRAVENOUS | Status: AC | PRN
  Administered 2021-12-26: 100 mL via INTRAVENOUS

## 2021-12-26 MED ORDER — FAMOTIDINE IN NACL 20-0.9 MG/50ML-% IV SOLN
20.0000 mg | Freq: Once | INTRAVENOUS | Status: AC
  Administered 2021-12-26: 20 mg via INTRAVENOUS
  Filled 2021-12-26: qty 50

## 2021-12-26 NOTE — ED Triage Notes (Signed)
Pt presents to ER c/o emesis for last 3 days.  Pt states he has been feeling like he has GERD and thinks that is why he keeps vomiting.  Pt c/o some chest pain in the substernal area that is non-radiating in nature.  Pt denies sob, or dizziness.  Pt is A&O x4 at this time in NAD in triage.

## 2021-12-26 NOTE — ED Provider Notes (Signed)
[facility] Provider Note    Event Date/Time   First MD Initiated Contact with Patient 12/26/21 [phone]     (approximate)   History   Emesis   HPI  Tom Bonn. is a 56 y.o. male who presents to the ED from home with a chief complaint of nausea/vomiting x3 days.  History of same.  Feels like he has GERD.  Began with diarrhea but now vomiting after drinking 3 beers and eating.  Denies fever, cough, shortness of breath, dysuria or dizziness.     Past Medical History   Past Medical History:  Diagnosis Date   Psoriatic arthritis [facility])      Active Problem List   Patient Active Problem List   Diagnosis Date Noted   MDD (major depressive disorder), recurrent episode, severe (HCC) 10/26/2019   Overdose of antipsychotic 10/26/2019   Bipolar depression (HCC) 10/09/2019   Benzodiazepine overdose 10/09/2019   Depression 10/09/2019     Past Surgical History  History reviewed. No pertinent surgical history.   Home Medications   Prior to Admission medications   Medication Sig Start Date End Date Taking? Authorizing Provider  FLUoxetine (PROZAC) 10 MG capsule Take 1 capsule (10 mg total) by mouth daily. 10/31/19   Money, Gerlene Burdock, FNP  lithium carbonate (LITHOBID) 300 MG CR tablet Take 2 tablets (600 mg total) by mouth at bedtime. 10/30/19   Money, Gerlene Burdock, FNP  meclizine (ANTIVERT) 25 MG tablet Take 1 tablet (25 mg total) by mouth 3 (three) times daily as needed for dizziness. 10/21/21   Dionne Bucy, MD  OLANZapine (ZYPREXA) 5 MG tablet Take 1 tablet (5 mg total) by mouth at bedtime. 10/30/19   Money, Gerlene Burdock, FNP     Allergies  Patient has no known allergies.   Family History  History reviewed. No pertinent family history.   Physical Exam  Triage Vital Signs: ED Triage Vitals  Enc Vitals Group     BP 12/26/21 0205 (!) 116/97     Pulse Rate 12/26/21 0205 95     Resp 12/26/21 0205 16     Temp 12/26/21 0205 97.7 F (36.5 C)      Temp Source 12/26/21 0205 Oral     SpO2 12/26/21 0205 99 %     Weight 12/26/21 0202 145 lb (65.8 kg)     Height 12/26/21 0202 5\' 10"  (1.778 m)     Head Circumference --      Peak Flow --      Pain Score 12/26/21 0211 3     Pain Loc --      Pain Edu? --      Excl. in GC? --     Updated Vital Signs: BP (!) 116/97 (BP Location: Left Arm)   Pulse 85   Temp 97.7 F (36.5 C) (Oral)   Resp 16   Ht 5\' 10"  (1.778 m)   Wt 65.8 kg   SpO2 99%   BMI 20.81 kg/m    General: Awake, mild distress.  Mildly dry mucous membranes. CV:  RRR.  Good peripheral perfusion.  Resp:  Normal effort.  CTA B. Abd:  Nontender to light or deep palpation.  No distention.  Other:  Calves are nontender and nonswollen.   ED Results / Procedures / Treatments  Labs (all labs ordered are listed, but only abnormal results are displayed) Labs Reviewed  CBC - Abnormal; Notable for the following components:      Result Value   WBC  15.1 (*)    All other components within normal limits  COMPREHENSIVE METABOLIC PANEL - Abnormal; Notable for the following components:   Sodium 128 (*)    Potassium 3.4 (*)    Chloride 87 (*)    Glucose, Bld 131 (*)    Total Bilirubin 1.3 (*)    All other components within normal limits  LIPASE, BLOOD - Abnormal; Notable for the following components:   Lipase 71 (*)    All other components within normal limits  URINALYSIS, ROUTINE W REFLEX MICROSCOPIC - Abnormal; Notable for the following components:   Color, Urine YELLOW (*)    APPearance HAZY (*)    Hgb urine dipstick SMALL (*)    Ketones, ur 5 (*)    All other components within normal limits  LITHIUM LEVEL - Abnormal; Notable for the following components:   Lithium Lvl <0.06 (*)    All other components within normal limits  TROPONIN I (HIGH SENSITIVITY)     EKG  ED ECG REPORT I, Sayer Masini J, the attending physician, personally viewed and interpreted this ECG.   Date: 12/26/2021  EKG Time: 0209  Rate: 92   Rhythm: normal sinus rhythm  Axis: Normal  Intervals:none  ST&T Change: Nonspecific    RADIOLOGY I have independently visualized and interpreted patient's CT scan as well as noted the radiology interpretation:  CT abdomen/pelvis: No acute intra-abdominal pathology  Official radiology report(s): CT Abdomen Pelvis W Contrast  Result Date: 12/26/2021 CLINICAL DATA:  Nausea/vomiting EXAM: CT ABDOMEN AND PELVIS WITH CONTRAST TECHNIQUE: Multidetector CT imaging of the abdomen and pelvis was performed using the standard protocol following bolus administration of intravenous contrast. RADIATION DOSE REDUCTION: This exam was performed according to the departmental dose-optimization program which includes automated exposure control, adjustment of the mA and/or kV according to patient size and/or use of iterative reconstruction technique. CONTRAST:  100mL OMNIPAQUE IOHEXOL 350 MG/ML SOLN COMPARISON:  None Available. FINDINGS: Lower chest: No acute abnormality. Hepatobiliary: No focal liver abnormality is seen. No gallstones, gallbladder wall thickening, or biliary dilatation. Pancreas: Unremarkable Spleen: Unremarkable Adrenals/Urinary Tract: Adrenal glands are unremarkable. Kidneys are normal, without renal calculi, focal lesion, or hydronephrosis. Bladder is unremarkable. Stomach/Bowel: Stomach is within normal limits. Appendix appears normal. No evidence of bowel wall thickening, distention, or inflammatory changes. No free intraperitoneal gas or fluid. Vascular/Lymphatic: Aortic atherosclerosis. No enlarged abdominal or pelvic lymph nodes. Reproductive: Prostate is unremarkable. Other: No abdominal wall hernia. Musculoskeletal: No acute bone abnormality. No lytic or blastic bone lesion. Degenerative changes are seen within the lumbar spine. IMPRESSION: No acute intra-abdominal pathology identified. No definite radiographic explanation for the patient's reported symptoms. Aortic Atherosclerosis (ICD10-I70.0).  Electronically Signed   By: Helyn NumbersAshesh  Parikh M.D.   On: 12/26/2021 03:47     PROCEDURES:  Critical Care performed: No  .1-3 Lead EKG Interpretation Performed by: Irean HongSung, Danni Shima J, MD Authorized by: Irean HongSung, Fernandez Kenley J, MD     Interpretation: normal     ECG rate:  90   ECG rate assessment: normal     Rhythm: sinus rhythm     Ectopy: none     Conduction: normal   Comments:     Patient placed on cardiac monitor to evaluate for arrhythmias   MEDICATIONS ORDERED IN ED: Medications  sodium chloride 0.9 % bolus 1,000 mL (0 mLs Intravenous Stopped 12/26/21 0548)  ondansetron (ZOFRAN) injection 4 mg (4 mg Intravenous Given 12/26/21 0310)  famotidine (PEPCID) IVPB 20 mg premix (0 mg Intravenous Stopped 12/26/21 0548)  iohexol (  OMNIPAQUE) 350 MG/ML injection 100 mL (100 mLs Intravenous Contrast Given 12/26/21 0320)     IMPRESSION / MDM / ASSESSMENT AND PLAN / ED COURSE  I reviewed the triage vital signs and the nursing notes.                             56 year old male presenting with nausea/vomiting.  I have identified this patient to have a potentially life-threatening condition. Differential diagnosis includes, but is not limited to, acute appendicitis, renal colic, testicular torsion, urinary tract infection/pyelonephritis, prostatitis,  epididymitis, diverticulitis, small bowel obstruction or ileus, colitis, abdominal aortic aneurysm, gastroenteritis, hernia, etc.   I have personally reviewed patient's records and see a PCP office visit from 10/21/2021 for dizziness/vertigo.  He had behavioral medicine admissions in March 2021.  The patient is on the cardiac monitor to evaluate for evidence of arrhythmia and/or significant heart rate changes.  Laboratory results demonstrate moderate leukocytosis with WBC 15.1, hyponatremia sodium 128 which is new from prior, mildly elevated T. bili 1.3, mildly elevated lipase 71, normal troponin; ketonuria.  Will initiate IV fluid resuscitation, IV Zofran for  vomiting, IV Pepcid.  Check lithium level and obtain CT abdomen/pelvis.  Will reassess.  Clinical Course as of 12/26/21 0550  Fri Dec 26, 2021  0509 Patient feeling better.  Updated patient and mother on negative CT scan results.  Will trial ice chips. [JS]  [id number] Patient tolerated ice chips without emesis.  Will discharge home with as needed prescription for Zofran and start pantoprazole daily.  Strict return precautions given.  Patient and his mother verbalized understanding agree with plan of care [JS]    Clinical Course User Index [JS] Irean Hong, MD     FINAL CLINICAL IMPRESSION(S) / ED DIAGNOSES   Final diagnoses:  Nausea and vomiting, unspecified vomiting type  Dehydration  Hyponatremia  Gastroesophageal reflux disease, unspecified whether esophagitis present     Rx / DC Orders   ED Discharge Orders     None        Note:  This document was prepared using Dragon voice recognition software and may include unintentional dictation errors.   Irean Hong, MD 12/26/21 0700

## 2021-12-26 NOTE — ED Notes (Signed)
Pt DC to home. DC instructions reviewed with all questions answered. Pt verbalizes understanding. IV removed, cath intact, pressure dressing applied. Pt ambulatory out of dept with steady gait with family member.

## 2021-12-26 NOTE — Discharge Instructions (Signed)
1.  Start Protonix 40 mg daily. 2.  You may take Zofran as needed for nausea/vomiting. 3.  Clear liquids x12 hours, then bland diet x3 days, then slowly advance diet as tolerated. 4.  Return to the ER for worsening symptoms, persistent vomiting, difficulty breathing or other concerns.

## 2021-12-27 ENCOUNTER — Emergency Department
Admission: EM | Admit: 2021-12-27 | Discharge: 2021-12-27 | Disposition: A | Discharge status: Home / Self Care | Payer: BC Managed Care – PPO | Attending: Emergency Medicine | Admitting: Emergency Medicine

## 2021-12-27 DIAGNOSIS — R1084 Generalized abdominal pain: Secondary | ICD-10-CM | POA: Diagnosis not present

## 2021-12-27 DIAGNOSIS — E871 Hypo-osmolality and hyponatremia: Secondary | ICD-10-CM | POA: Diagnosis not present

## 2021-12-27 DIAGNOSIS — R112 Nausea with vomiting, unspecified: Secondary | ICD-10-CM | POA: Diagnosis present

## 2021-12-27 DIAGNOSIS — K219 Gastro-esophageal reflux disease without esophagitis: Secondary | ICD-10-CM | POA: Insufficient documentation

## 2021-12-27 DIAGNOSIS — R110 Nausea: Secondary | ICD-10-CM

## 2021-12-27 LAB — URINALYSIS, ROUTINE W REFLEX MICROSCOPIC
Bilirubin Urine: NEGATIVE
Glucose, UA: NEGATIVE mg/dL
Hgb urine dipstick: NEGATIVE
Ketones, ur: NEGATIVE mg/dL
Leukocytes,Ua: NEGATIVE
Nitrite: NEGATIVE
Protein, ur: 30 mg/dL — AB
Specific Gravity, Urine: 1.018 (ref 1.005–1.030)
Squamous Epithelial / HPF: NONE SEEN (ref 0–5)
pH: 9 — ABNORMAL HIGH (ref 5.0–8.0)

## 2021-12-27 LAB — COMPREHENSIVE METABOLIC PANEL
ALT: 27 U/L (ref 0–44)
AST: 26 U/L (ref 15–41)
Albumin: 4.2 g/dL (ref 3.5–5.0)
Alkaline Phosphatase: 91 U/L (ref 38–126)
Anion gap: 9 (ref 5–15)
BUN: 14 mg/dL (ref 6–20)
CO2: 32 mmol/L (ref 22–32)
Calcium: 9 mg/dL (ref 8.9–10.3)
Chloride: 91 mmol/L — ABNORMAL LOW (ref 98–111)
Creatinine, Ser: 0.82 mg/dL (ref 0.61–1.24)
GFR, Estimated: >60 mL/min (ref >60)
Glucose, Bld: 131 mg/dL — ABNORMAL HIGH (ref 70–99)
Potassium: 3.5 mmol/L (ref 3.5–5.1)
Sodium: 132 mmol/L — ABNORMAL LOW (ref 135–145)
Total Bilirubin: 1.2 mg/dL (ref 0.3–1.2)
Total Protein: 6.9 g/dL (ref 6.5–8.1)

## 2021-12-27 LAB — ETHANOL: Alcohol, Ethyl (B): <10 mg/dL (ref <10)

## 2021-12-27 LAB — CBC
HCT: 45.8 % (ref 39.0–52.0)
Hemoglobin: 15.9 g/dL (ref 13.0–17.0)
MCH: 33.8 pg (ref 26.0–34.0)
MCHC: 34.7 g/dL (ref 30.0–36.0)
MCV: 97.4 fL (ref 80.0–100.0)
Platelets: 327 10*3/uL (ref 150–400)
RBC: 4.7 MIL/uL (ref 4.22–5.81)
RDW: 12 % (ref 11.5–15.5)
WBC: 13.1 10*3/uL — ABNORMAL HIGH (ref 4.0–10.5)
nRBC: 0 % (ref 0.0–0.2)

## 2021-12-27 LAB — LIPASE, BLOOD: Lipase: 36 U/L (ref 11–51)

## 2021-12-27 LAB — LITHIUM LEVEL: Lithium Lvl: <0.06 mmol/L — ABNORMAL LOW (ref 0.60–1.20)

## 2021-12-27 MED ORDER — DICYCLOMINE HCL 10 MG/ML IM SOLN
20.0000 mg | Freq: Once | INTRAMUSCULAR | Status: AC
  Administered 2021-12-27: 20 mg via INTRAMUSCULAR
  Filled 2021-12-27: qty 2

## 2021-12-27 MED ORDER — DICYCLOMINE HCL 10 MG PO CAPS: 10.0000 mg | ORAL_CAPSULE | Freq: Three times a day (TID) | ORAL | 0 refills | Status: AC

## 2021-12-27 MED ORDER — METOCLOPRAMIDE HCL 10 MG PO TABS: 10.0000 mg | ORAL_TABLET | Freq: Four times a day (QID) | ORAL | 0 refills | Status: AC | PRN

## 2021-12-27 MED ORDER — METOCLOPRAMIDE HCL 5 MG/ML IJ SOLN
10.0000 mg | Freq: Once | INTRAMUSCULAR | Status: AC
Start: 2021-12-27 — End: 2021-12-27
  Administered 2021-12-27: 10 mg via INTRAVENOUS
  Filled 2021-12-27: qty 2

## 2021-12-27 MED ORDER — LACTATED RINGERS IV BOLUS
1000.0000 mL | Freq: Once | INTRAVENOUS | Status: AC
  Administered 2021-12-27: 1000 mL via INTRAVENOUS

## 2021-12-27 NOTE — ED Provider Notes (Signed)
[facility] Provider Note    Event Date/Time   First MD Initiated Contact with Patient 12/27/21 1126     (approximate)   History   Emesis   HPI  Tom Smith. is a 56 y.o. male  with a history of psoriatic arthritis, GERD and as listed in EMR presents to the emergency department for treatment and evaluation of abdominal cramping and nausea. He was evaluated here yesterday for similar symptoms and treated with IV fluids and medications. He felt better and was able to eat and tolerate fluids after going home, but this morning, abdominal cramping is severe and he is nauseated. No vomiting or diarrhea today. He was unable to afford the antacid prescribed yesterday, but did get his Xanax filled.      Physical Exam   Triage Vital Signs: ED Triage Vitals  Enc Vitals Group     BP 12/27/21 0907 119/90     Pulse Rate 12/27/21 0907 78     Resp 12/27/21 0907 18     Temp 12/27/21 0907 97.7 F (36.5 C)     Temp Source 12/27/21 0907 Oral     SpO2 12/27/21 0907 98 %     Weight --      Height --      Head Circumference --      Peak Flow --      Pain Score 12/27/21 0906 10     Pain Loc --      Pain Edu? --      Excl. in GC? --     Most recent vital signs: Vitals:   12/27/21 0907 12/27/21 1313  BP: 119/90 (!) 143/84  Pulse: 78 (!) 55  Resp: 18 16  Temp: 97.7 F (36.5 C)   SpO2: 98% 96%    General: Awake, no distress.  CV:  Good peripheral perfusion.  Resp:  Normal effort.  Abd:  No distention. No focal abdominal tenderness, rebound, or gurading Other:     ED Results / Procedures / Treatments   Labs (all labs ordered are listed, but only abnormal results are displayed) Labs Reviewed  COMPREHENSIVE METABOLIC PANEL - Abnormal; Notable for the following components:      Result Value   Sodium 132 (*)    Chloride 91 (*)    Glucose, Bld 131 (*)    All other components within normal limits  CBC - Abnormal; Notable for the following components:    WBC 13.1 (*)    All other components within normal limits  URINALYSIS, ROUTINE W REFLEX MICROSCOPIC - Abnormal; Notable for the following components:   Color, Urine AMBER (*)    APPearance CLOUDY (*)    pH 9.0 (*)    Protein, ur 30 (*)    Bacteria, UA RARE (*)    All other components within normal limits  LITHIUM LEVEL - Abnormal; Notable for the following components:   Lithium Lvl <0.06 (*)    All other components within normal limits  LIPASE, BLOOD  ETHANOL     EKG  Not indicated   RADIOLOGY  Not indicated.  PROCEDURES:  Critical Care performed: No  Procedures   MEDICATIONS ORDERED IN ED: Medications  dicyclomine (BENTYL) injection 20 mg (20 mg Intramuscular Given 12/27/21 1154)  lactated ringers bolus 1,000 mL (0 mLs Intravenous Stopped 12/27/21 1446)  metoCLOPramide (REGLAN) injection 10 mg (10 mg Intravenous Given 12/27/21 1313)     IMPRESSION / MDM / ASSESSMENT AND PLAN / ED COURSE  I have reviewed the triage note.  Differential diagnosis includes, but is not limited to: Acute viral syndrome; GERD; ileus, small bowel obstruction  56 year old male presents for treatment and evaluation of abdominal pain with nausea.  See HPI for further details.  Patient's pain and symptoms are noted different than when he was here yesterday.  CT scan from yesterday did not show any acute concerns.  I do not feel that a repeat CT is indicated today.  Labs in comparison to values from yesterday are reassuring.  White blood cell count has dropped from 15-13.1.  Was mildly hyponatremic at 132, chloride of 91 glucose of 131.  While here, he was given a liter of lactated Ringer's.  This should correct those values.  He was also given Bentyl which did provide some relief and Reglan as well.  These medications will be prescribed upon discharge and hopefully will be affordable.  He will be given a referral to see gastroenterology if symptoms do not improve.  ER return precautions were  discussed.      FINAL CLINICAL IMPRESSION(S) / ED DIAGNOSES   Final diagnoses:  Nausea  Generalized abdominal pain     Rx / DC Orders   ED Discharge Orders          Ordered    dicyclomine (BENTYL) 10 MG capsule  3 times daily before meals & bedtime        12/27/21 1432    metoCLOPramide (REGLAN) 10 MG tablet  Every 6 hours PRN        12/27/21 1432             Note:  This document was prepared using Dragon voice recognition software and may include unintentional dictation errors.   Chinita Pester, FNP 12/27/21 1605    Minna Antis, MD 12/27/21 1901

## 2021-12-27 NOTE — ED Notes (Signed)
States he picked up the nausea medication that was prescribed yesterday. He could not afford the antacid they prescribed but got some xanax instead and now is feeling worse than he did for the past 2 days. He describes crampy central abdominal pain rated 10/10. Speaking in clear and complete sentences and does not appear to be in acute distress. He notes that he is "ready to say goodbye and not be on this side of life anymore" because of his discomfort.

## 2021-12-27 NOTE — ED Triage Notes (Signed)
Pt comes pov from home. Was seen here yesterday and dx with gerd and given meds. States she doesn't think it's gerd anymore. Nausea with some emesis but says "this is not gerd anymore" Thinks it may be related to a tick bite Tuesday.

## 2022-07-26 IMAGING — CT CT HEAD W/O CM
4 series · 17 of 47 positions shown, 19 images · non-contrast
Comparison: CT head October 25, 2019.

CLINICAL DATA: Dizziness, non-specific



[Series 2: head wo · axial · 0.41mm/px · z∈[+24,+144]mm · 7 of 34 slices shown, 9 images]
[im 5/34  brain]
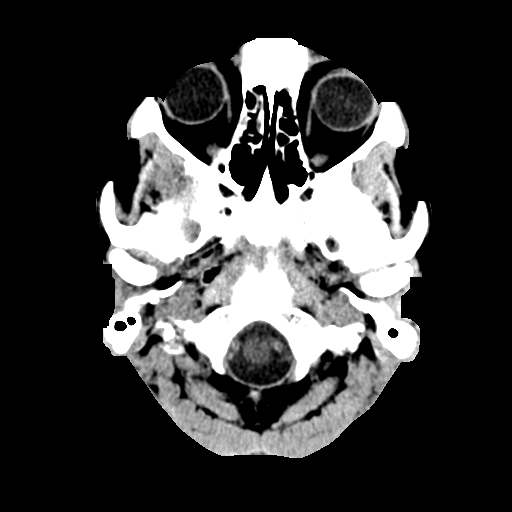
[im 5/34  bone]
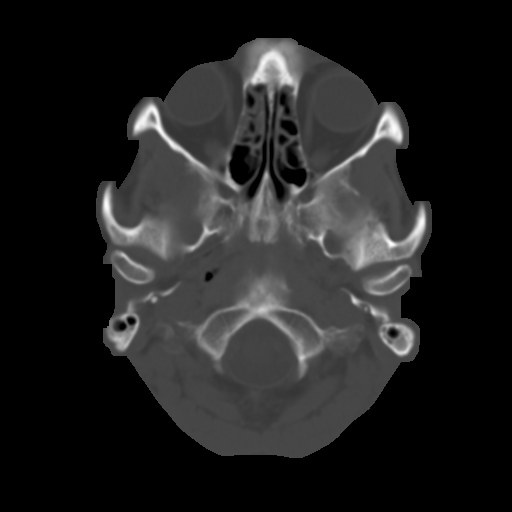
[im 9/34  brain]
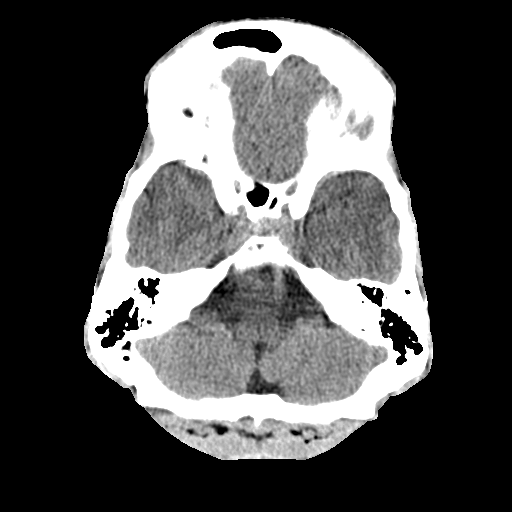
[im 13/34  brain]
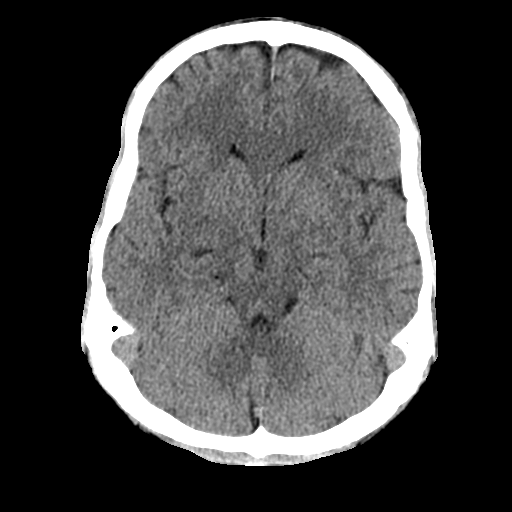
[im 17/34  brain]
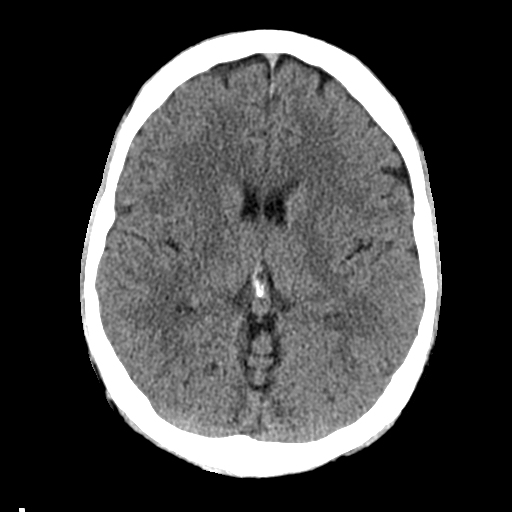
[im 21/34  brain]
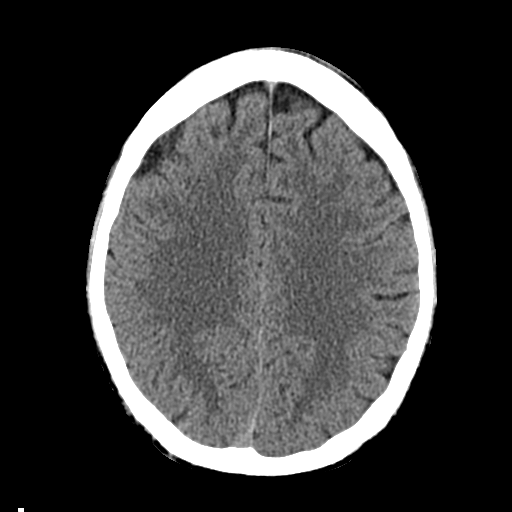
[im 21/34  bone]
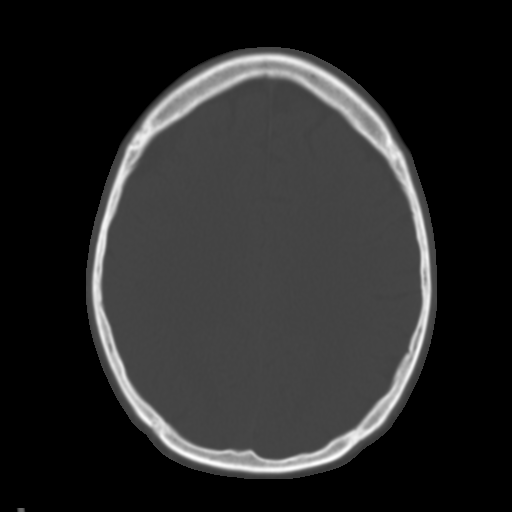
[im 25/34  brain]
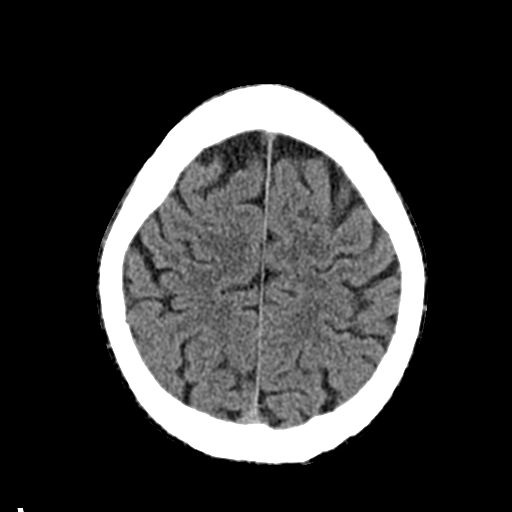
[im 29/34  brain]
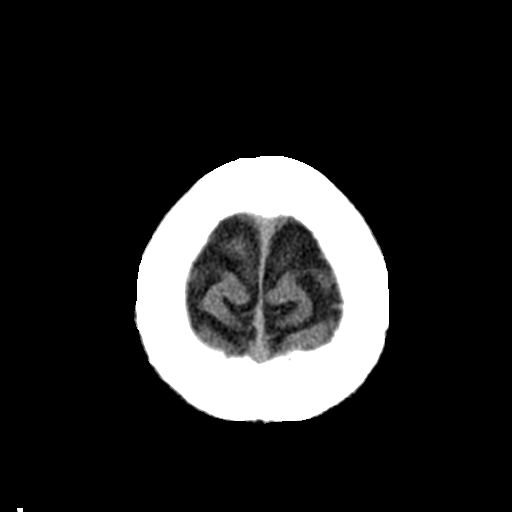

[Series 3: head bone · axial · 0.41mm/px · z∈[+20,+78]mm · 4 of 84 slices shown]
[im 9/84  bone]
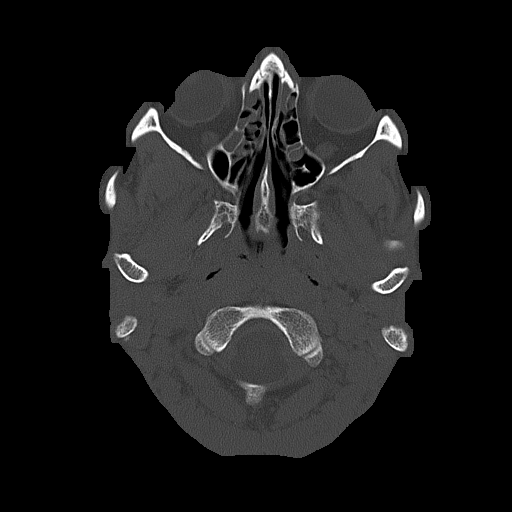
[im 17/84  bone]
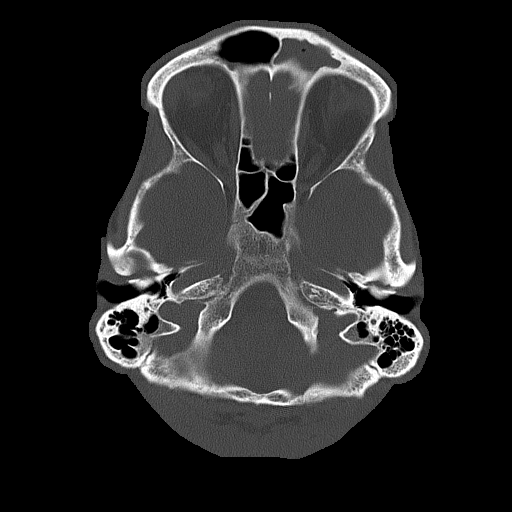
[im 25/84  bone]
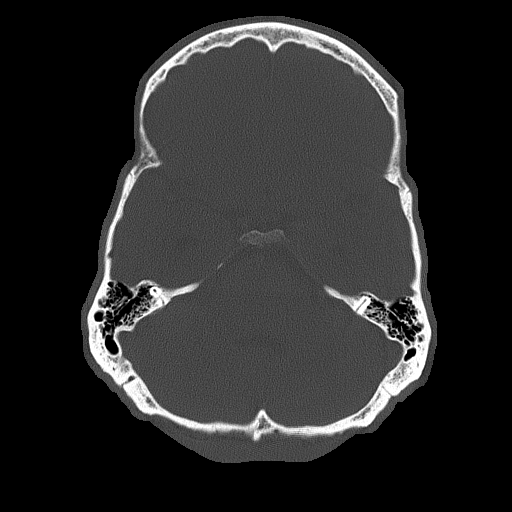
[im 38/84  bone]
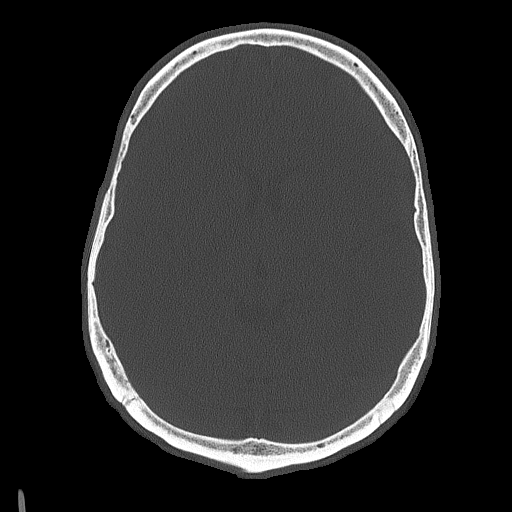

[Series 4: coronal soft tissue · coronal · 0.33mm/px · 3 of 77 slices shown]
[im 26/77  brain]
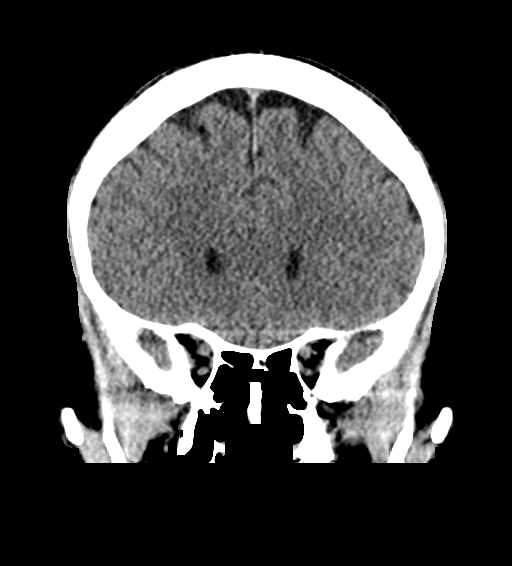
[im 34/77  brain]
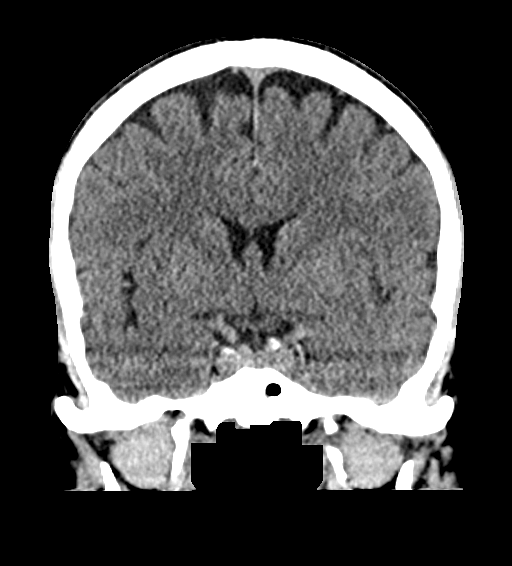
[im 43/77  brain]
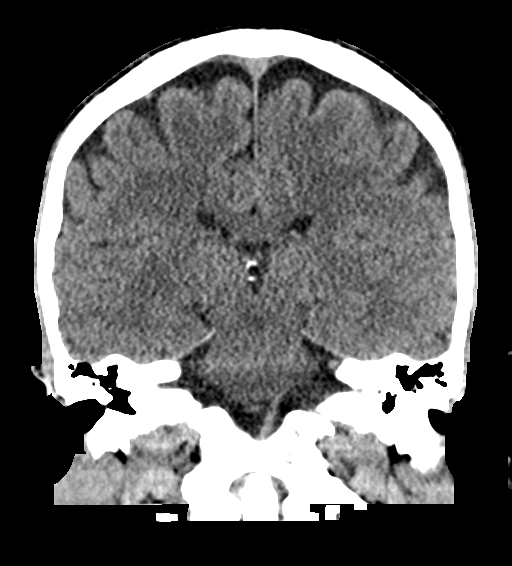

[Series 5: sagittal soft tissue · sagittal · 0.38mm/px · 3 of 58 slices shown]
[im 20/58  brain]
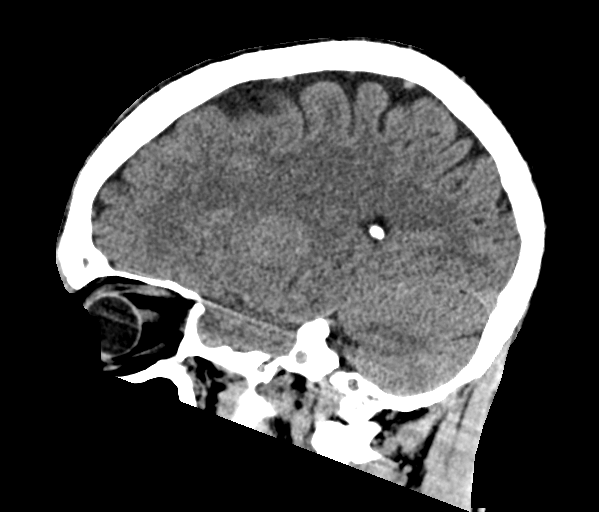
[im 29/58  brain]
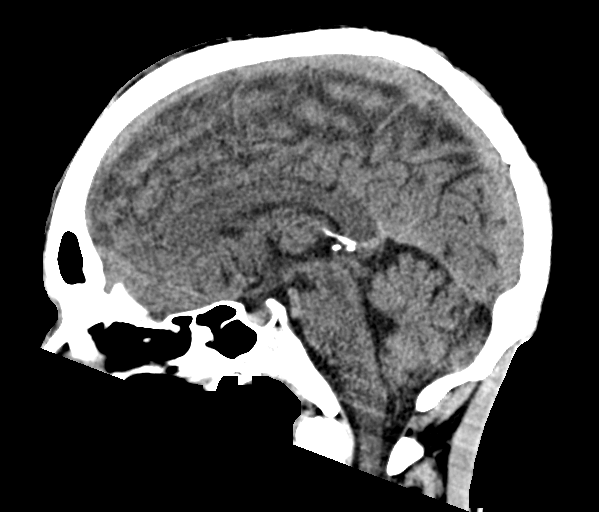
[im 39/58  brain]
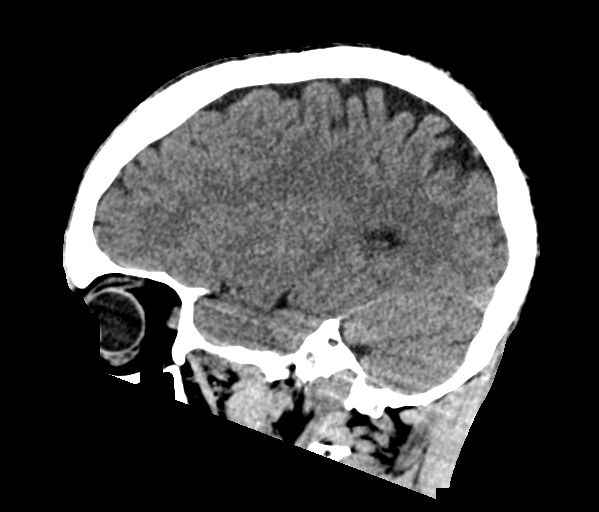

[17 of 47 positions shown; findings below may reference images not displayed]

FINDINGS: Brain: No evidence of acute infarction, hemorrhage, hydrocephalus,
extra-axial collection or mass lesion/mass effect.

Vascular: No hyperdense vessel identified.

Skull: No acute fracture.

Sinuses/Orbits: Moderate mucosal thickening of scattered ethmoid air
cells and visualized left maxillary sinus. Near complete left
frontal opacification.

Other: No acute orbital findings.
IMPRESSION: No evidence of acute intracranial abnormality.

## 2022-08-11 ENCOUNTER — Other Ambulatory Visit: Payer: Self-pay

## 2022-08-11 ENCOUNTER — Ambulatory Visit
Admission: RE | Admit: 2022-08-11 | Discharge: 2022-08-11 | Disposition: A | Discharge status: Home / Self Care | Payer: No Typology Code available for payment source | Source: Ambulatory Visit | Attending: Student

## 2022-08-11 ENCOUNTER — Other Ambulatory Visit: Payer: Self-pay | Admitting: Student

## 2022-08-11 ENCOUNTER — Emergency Department
Admission: EM | Admit: 2022-08-11 | Discharge: 2022-08-11 | Disposition: A | Discharge status: Home / Self Care | Payer: No Typology Code available for payment source | Attending: Emergency Medicine | Admitting: Emergency Medicine

## 2022-08-11 DIAGNOSIS — M79661 Pain in right lower leg: Secondary | ICD-10-CM

## 2022-08-11 DIAGNOSIS — I809 Phlebitis and thrombophlebitis of unspecified site: Secondary | ICD-10-CM

## 2022-08-11 LAB — CBC WITH DIFFERENTIAL/PLATELET
Abs Immature Granulocytes: 0.03 10*3/uL (ref 0.00–0.07)
Basophils Absolute: 0.1 10*3/uL (ref 0.0–0.1)
Basophils Relative: 1 %
Eosinophils Absolute: 0.4 10*3/uL (ref 0.0–0.5)
Eosinophils Relative: 4 %
HCT: 44.3 % (ref 39.0–52.0)
Hemoglobin: 15 g/dL (ref 13.0–17.0)
Immature Granulocytes: 0 %
Lymphocytes Relative: 30 %
Lymphs Abs: 3.3 10*3/uL (ref 0.7–4.0)
MCH: 33.1 pg (ref 26.0–34.0)
MCHC: 33.9 g/dL (ref 30.0–36.0)
MCV: 97.8 fL (ref 80.0–100.0)
Monocytes Absolute: 0.5 10*3/uL (ref 0.1–1.0)
Monocytes Relative: 5 %
Neutro Abs: 6.9 10*3/uL (ref 1.7–7.7)
Neutrophils Relative %: 60 %
Platelets: 315 10*3/uL (ref 150–400)
RBC: 4.53 MIL/uL (ref 4.22–5.81)
RDW: 12.3 % (ref 11.5–15.5)
WBC: 11.2 10*3/uL — ABNORMAL HIGH (ref 4.0–10.5)
nRBC: 0 % (ref 0.0–0.2)

## 2022-08-11 LAB — BASIC METABOLIC PANEL
Anion gap: 8 (ref 5–15)
BUN: 11 mg/dL (ref 6–20)
CO2: 22 mmol/L (ref 22–32)
Calcium: 8.3 mg/dL — ABNORMAL LOW (ref 8.9–10.3)
Chloride: 104 mmol/L (ref 98–111)
Creatinine, Ser: 0.88 mg/dL (ref 0.61–1.24)
GFR, Estimated: >60 mL/min (ref >60)
Glucose, Bld: 166 mg/dL — ABNORMAL HIGH (ref 70–99)
Potassium: 3.7 mmol/L (ref 3.5–5.1)
Sodium: 134 mmol/L — ABNORMAL LOW (ref 135–145)

## 2022-08-11 LAB — PROTIME-INR
INR: 0.9 (ref 0.8–1.2)
Prothrombin Time: 12.5 seconds (ref 11.4–15.2)

## 2022-08-11 MED ORDER — MELOXICAM 15 MG PO TABS: 15.0000 mg | ORAL_TABLET | Freq: Every day | ORAL | 0 refills | Status: AC

## 2022-08-11 NOTE — ED Provider Notes (Signed)
[facility] Provider Note  Patient Contact: 10:47 PM (approximate)   History   blood clot   HPI  Tom Smith. is a 57 y.o. male who presents emergency department complaining of blood clot in his leg.  Patient was having pain in his right calf, went to [place] [facility] and had an ultrasound performed.  Patient states that he received a phone call and stated that he had a blood clot and needed to come to the emergency department.  Patient denies any chest pain, shortness of breath.  No history of bleeding or clotting disorders.  No provoking factors such as recent surgery or long travel.  Patient states that it improves with ambulation and heat.  Patient has no other complaints at this time.     Physical Exam   Triage Vital Signs: ED Triage Vitals  Enc Vitals Group     BP 08/11/22 1801 135/76     Pulse Rate 08/11/22 1801 100     Resp 08/11/22 1801 16     Temp 08/11/22 1801 98.2 F (36.8 C)     Temp Source 08/11/22 1801 Oral     SpO2 08/11/22 1801 100 %     Weight 08/11/22 1802 159 lb (72.1 kg)     Height 08/11/22 1802 5\' 10"  (1.778 m)     Head Circumference --      Peak Flow --      Pain Score 08/11/22 1802 3     Pain Loc --      Pain Edu? --      Excl. in GC? --     Most recent vital signs: Vitals:   08/11/22 1801 08/11/22 2122  BP: 135/76 114/71  Pulse: 100 (!) 50  Resp: 16 18  Temp: 98.2 F (36.8 C)   SpO2: 100% 95%     General: Alert and in no acute distress.  Cardiovascular:  Good peripheral perfusion Respiratory: Normal respiratory effort without tachypnea or retractions. Lungs CTAB.  Musculoskeletal: Full range of motion to all extremities.  Tenderness along the posterior calf.  No gross edema of the lower extremity.  Pulses sensation intact distally. Neurologic:  No gross focal neurologic deficits are appreciated.  Skin:   No rash noted Other:   ED Results / Procedures / Treatments   Labs (all labs ordered are listed, but  only abnormal results are displayed) Labs Reviewed  BASIC METABOLIC PANEL - Abnormal; Notable for the following components:      Result Value   Sodium 134 (*)    Glucose, Bld 166 (*)    Calcium 8.3 (*)    All other components within normal limits  CBC WITH DIFFERENTIAL/PLATELET - Abnormal; Notable for the following components:   WBC 11.2 (*)    All other components within normal limits  PROTIME-INR     EKG     RADIOLOGY  I personally viewed, evaluated, and interpreted these images as part of my medical decision making, as well as reviewing the written report by the radiologist.  ED Provider Interpretation: Outpatient ultrasound reviewed.  Patient has findings consistent with a superficial thrombophlebitis but no evidence of DVT.  2123 Venous Img Lower Unilateral Right (DVT)  Result Date: 08/11/2022 CLINICAL DATA:  RIGHT calf pain since 03/2022. EXAM: RIGHT LOWER EXTREMITY VENOUS DOPPLER ULTRASOUND TECHNIQUE: Gray-scale sonography with compression, as well as color and duplex ultrasound, were performed to evaluate the deep venous system(s) from the level of the common femoral vein through the popliteal and  proximal calf veins. COMPARISON:  CT AP, 12/26/2021. FINDINGS: VENOUS Normal compressibility of the RIGHT common femoral, superficial femoral, and popliteal veins, as well as the visualized calf veins. Visualized portions of profunda femoral vein are unremarkable. No filling defects to suggest DVT on grayscale or color Doppler imaging. Doppler waveforms show normal direction of venous flow, normal respiratory plasticity and response to augmentation. Heterogeneously echogenic filling defect of the greater saphenous vein at the level of the proximal calf. Limited views of the contralateral common femoral vein are unremarkable. OTHER No abnormal fluid collection. Limitations: none IMPRESSION: 1. No evidence of femoropopliteal DVT within the RIGHT lower extremity. 2. Examination is POSITIVE for  superficial thrombophlebitis of the RIGHT GSV at the level of the calf. Michaelle Birks, MD [facility] Radiology Electronically Signed   By: Michaelle Birks M.D.   On: 08/11/2022 15:52    PROCEDURES:  Critical Care performed: No  Procedures   MEDICATIONS ORDERED IN ED: Medications - No data to display   IMPRESSION / MDM / [place] / ED COURSE  I reviewed the triage vital signs and the nursing notes.                                 Differential diagnosis includes, but is not limited to, DVT, thrombophlebitis, cellulitis  Patient's presentation is most consistent with acute presentation with potential threat to life or bodily function.   Patient's diagnosis is consistent with thrombophlebitis.  Patient presents emergency department complaining of a possible blood clot in his lower extremity.  Patient went to clinical clinic, was called at home after he had an ultrasound revealed a "blood clot."  Labs reassuring at this time.  Ultrasound was reviewed by myself today.  There is evidence of thrombophlebitis but no evidence of DVT.  Patient will use warm compresses, continued activity and anti-inflammatory for symptom improvement.  Concerning signs and symptoms and return precautions discussed with the patient.  No further workup at this time.. Patient is given ED precautions to return to the ED for any worsening or new symptoms.     FINAL CLINICAL IMPRESSION(S) / ED DIAGNOSES   Final diagnoses:  Thrombophlebitis     Rx / DC Orders   ED Discharge Orders          Ordered    meloxicam (MOBIC) 15 MG tablet  Daily        08/11/22 2250             Note:  This document was prepared using Dragon voice recognition software and may include unintentional dictation errors.   Darletta Moll, PA-C 08/11/22 2251    Naaman Plummer, MD 08/11/22 2322

## 2022-08-11 NOTE — ED Triage Notes (Signed)
Pt reports that he had an u/s today and was told that he has a blood clot to the right calf area. Pt states that he has been bothered by this since august

## 2022-08-11 NOTE — ED Provider Triage Note (Signed)
Emergency Medicine Provider Triage Evaluation Note  Tom Smith. , a 57 y.o. male  was evaluated in triage.  Pt complains of blood clot in RLE.  He has had pain in the calf for a few months. [place] today shows DVT. Sent to ER.  Physical Exam  BP 135/76 (BP Location: Left Arm)   Pulse 100   Temp 98.2 F (36.8 C) (Oral)   Resp 16   Ht 5\' 10"  (1.778 m)   Wt 72.1 kg   SpO2 100%   BMI 22.81 kg/m  Gen:   Awake, no distress   Resp:  Normal effort  MSK:   Moves extremities without difficulty  Other:    Medical Decision Making  Medically screening exam initiated at 6:04 PM.  Appropriate orders placed.  Ilda Basset. was informed that the remainder of the evaluation will be completed by another provider, this initial triage assessment does not replace that evaluation, and the importance of remaining in the ED until their evaluation is complete.    Victorino Dike, FNP 08/11/22 1807

## 2022-09-30 IMAGING — CT CT ABD-PELV W/ CM
2 of 5 series · 16 of 46 positions shown, 18 images · IV contrast (APPLIED)
Comparison: None Available.

CLINICAL DATA: Nausea/vomiting

EXAM:
CT ABDOMEN AND PELVIS WITH CONTRAST
TECHNIQUE: Multidetector CT imaging of the abdomen and pelvis was performed
using the standard protocol following bolus administration of
intravenous contrast.

[Series 2: routine abd/pel with · axial · 0.66mm/px · z∈[-1036,-661]mm · 13 of 85 slices shown, 15 images]
[im 5/85  soft-tissue]
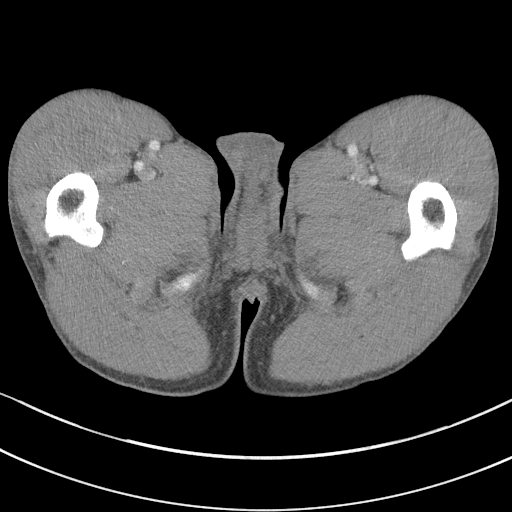
[im 5/85  bone]
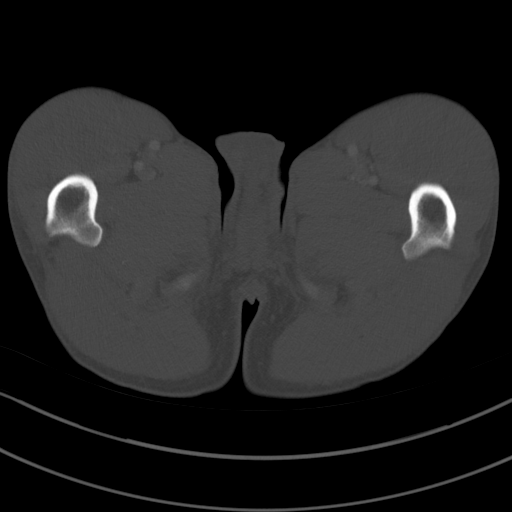
[im 10/85  soft-tissue]
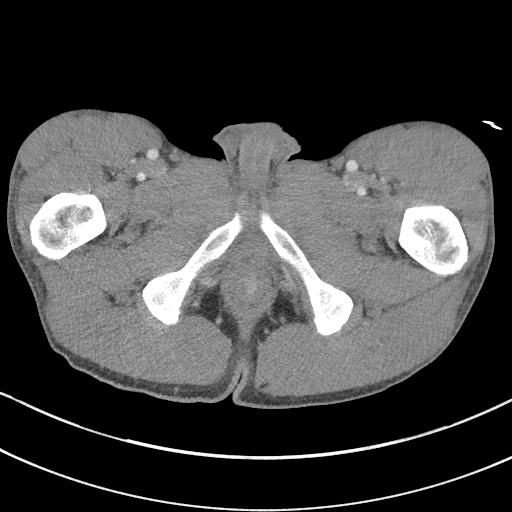
[im 19/85  soft-tissue]
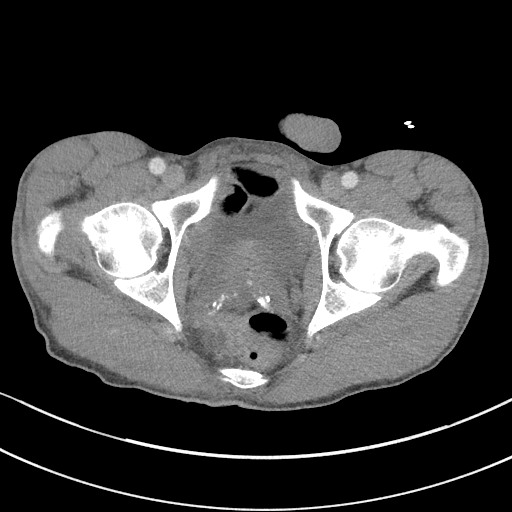
[im 24/85  soft-tissue]
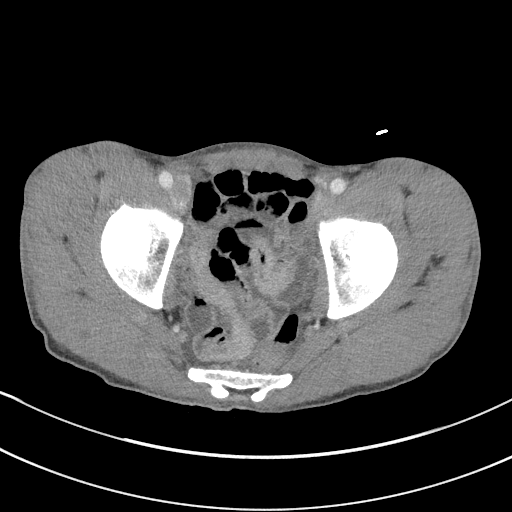
[im 29/85  soft-tissue]
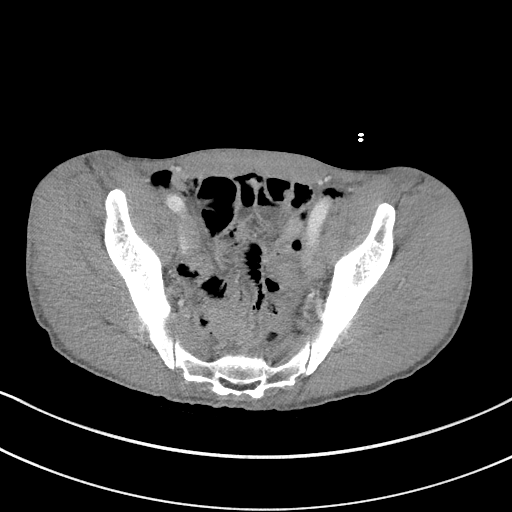
[im 38/85  soft-tissue]
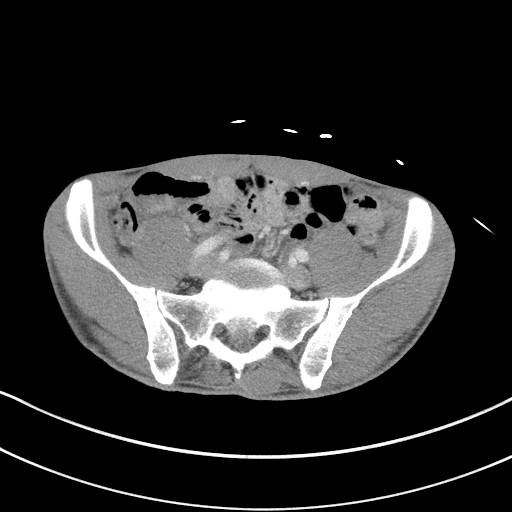
[im 43/85  soft-tissue]
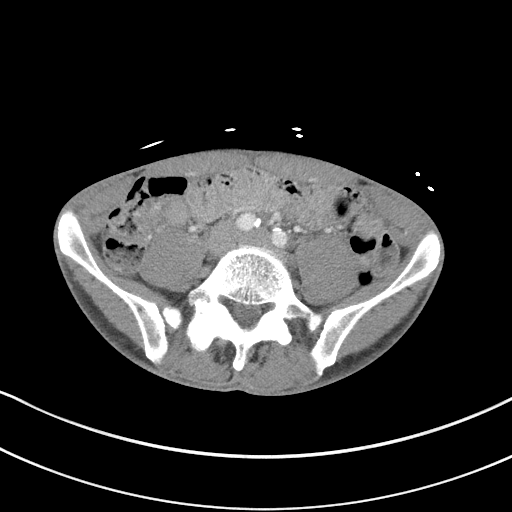
[im 47/85  soft-tissue]
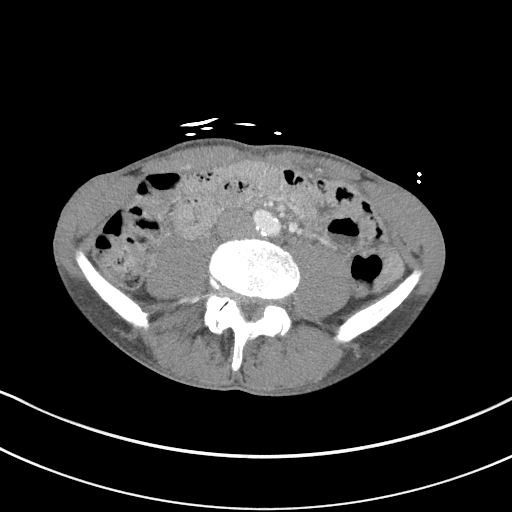
[im 57/85  soft-tissue]
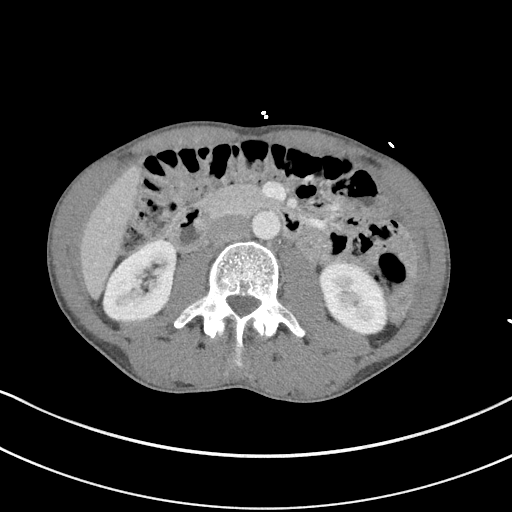
[im 57/85  bone]
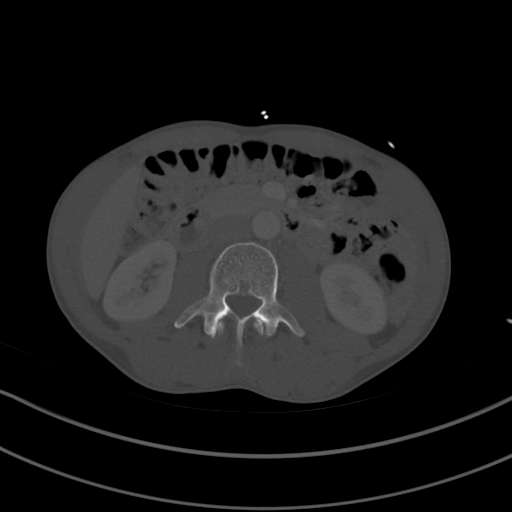
[im 61/85  soft-tissue]
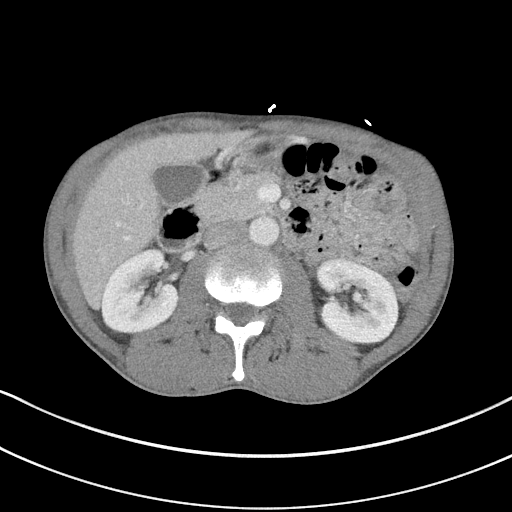
[im 66/85  soft-tissue]
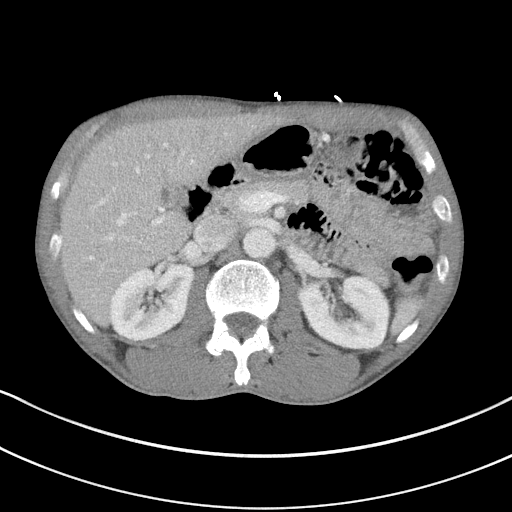
[im 75/85  soft-tissue]
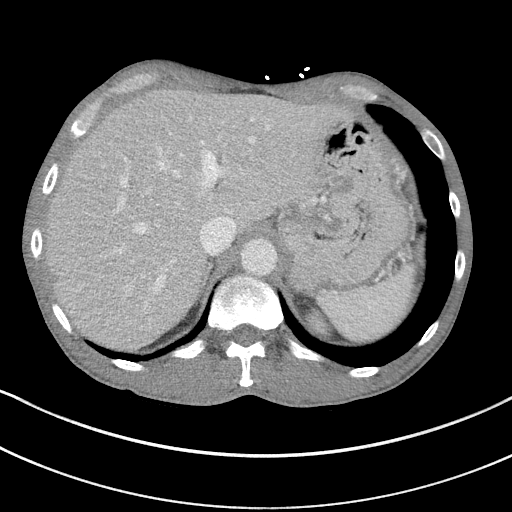
[im 80/85  soft-tissue]
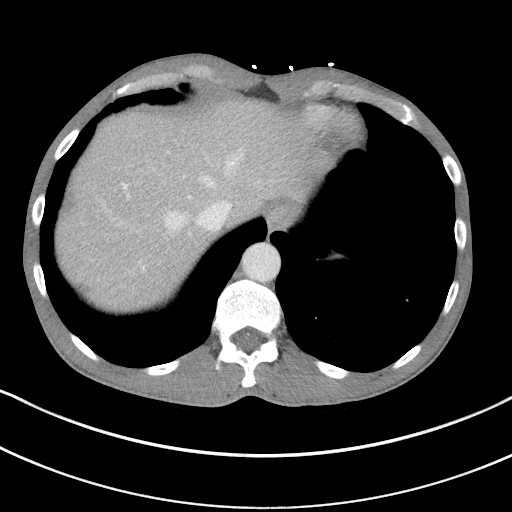

[Series 5: coronal st · coronal · 0.65mm/px · 3 of 80 slices shown]
[im 27/80  soft-tissue]
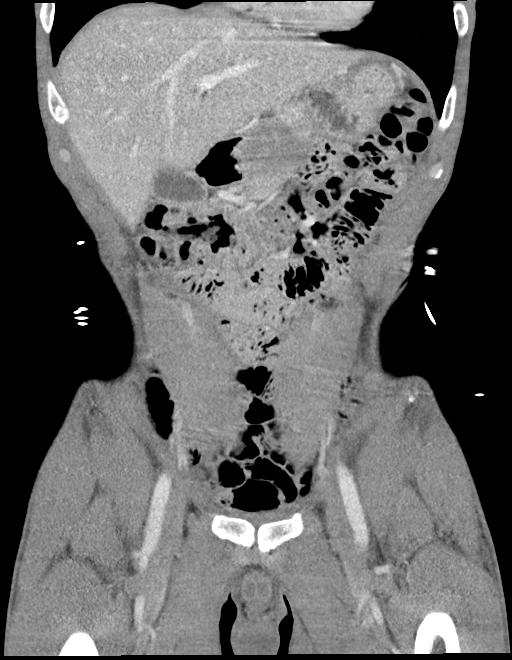
[im 36/80  soft-tissue]
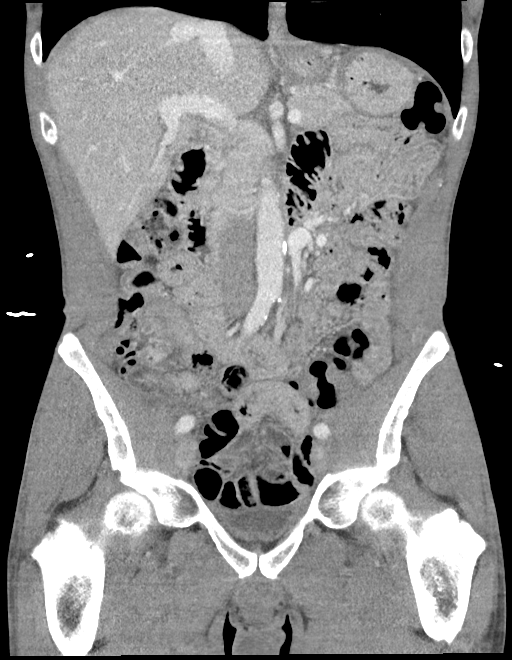
[im 44/80  soft-tissue]
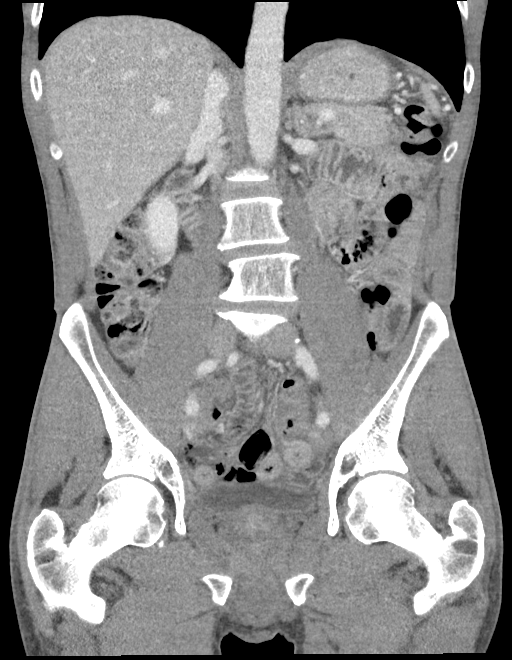

[16 of 46 positions shown; findings below may reference images not displayed]

RADIATION DOSE REDUCTION: This exam was performed according to the
departmental dose-optimization program which includes automated
exposure control, adjustment of the mA and/or kV according to
patient size and/or use of iterative reconstruction technique.

CONTRAST:  100mL OMNIPAQUE IOHEXOL 350 MG/ML SOLN
FINDINGS: Lower chest: No acute abnormality.

Hepatobiliary: No focal liver abnormality is seen. No gallstones,
gallbladder wall thickening, or biliary dilatation.

Pancreas: Unremarkable

Spleen: Unremarkable

Adrenals/Urinary Tract: Adrenal glands are unremarkable. Kidneys are
normal, without renal calculi, focal lesion, or hydronephrosis.
Bladder is unremarkable.

Stomach/Bowel: Stomach is within normal limits. Appendix appears
normal. No evidence of bowel wall thickening, distention, or
inflammatory changes. No free intraperitoneal gas or fluid.

Vascular/Lymphatic: Aortic atherosclerosis. No enlarged abdominal or
pelvic lymph nodes.

Reproductive: Prostate is unremarkable.

Other: No abdominal wall hernia.

Musculoskeletal: No acute bone abnormality. No lytic or blastic bone
lesion. Degenerative changes are seen within the lumbar spine.
IMPRESSION: No acute intra-abdominal pathology identified. No definite
radiographic explanation for the patient's reported symptoms.

Aortic Atherosclerosis ([id number]-C25.5).
